# Patient Record
Sex: Male | Born: 1995 | Race: Black or African American | Hispanic: No | Marital: Single | Smoking: Former smoker
Health system: Southern US, Community
[De-identification: ages and names within clinical notes are randomized; demographics above are authoritative.]

## PROBLEM LIST (undated history)

## (undated) DIAGNOSIS — R569 Unspecified convulsions: Secondary | ICD-10-CM

## (undated) HISTORY — PX: MOUTH SURGERY: SHX715

## (undated) HISTORY — DX: Unspecified convulsions: R56.9

---
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
## 2016-01-30 ENCOUNTER — Encounter (HOSPITAL_COMMUNITY): Payer: Self-pay | Admitting: Emergency Medicine

## 2016-01-30 ENCOUNTER — Emergency Department (HOSPITAL_COMMUNITY)
Admission: EM | Admit: 2016-01-30 | Discharge: 2016-01-30 | Disposition: A | Discharge status: Home / Self Care | Payer: BLUE CROSS/BLUE SHIELD | Attending: Emergency Medicine | Admitting: Emergency Medicine

## 2016-01-30 DIAGNOSIS — F17200 Nicotine dependence, unspecified, uncomplicated: Secondary | ICD-10-CM | POA: Insufficient documentation

## 2016-01-30 DIAGNOSIS — L0291 Cutaneous abscess, unspecified: Secondary | ICD-10-CM

## 2016-01-30 DIAGNOSIS — L0201 Cutaneous abscess of face: Secondary | ICD-10-CM | POA: Diagnosis present

## 2016-01-30 MED ORDER — SULFAMETHOXAZOLE-TRIMETHOPRIM 800-160 MG PO TABS
1.0000 | ORAL_TABLET | Freq: Once | ORAL | Status: AC
  Administered 2016-01-30: 1 via ORAL
  Filled 2016-01-30: qty 1

## 2016-01-30 MED ORDER — LIDOCAINE HCL 2 % IJ SOLN
20.0000 mL | Freq: Once | INTRAMUSCULAR | Status: AC
  Administered 2016-01-30: 400 mg
  Filled 2016-01-30: qty 20

## 2016-01-30 MED ORDER — SULFAMETHOXAZOLE-TRIMETHOPRIM 800-160 MG PO TABS: 1.0000 | ORAL_TABLET | Freq: Two times a day (BID) | ORAL | 0 refills | Status: AC

## 2016-01-30 NOTE — ED Provider Notes (Signed)
[facility] Provider Note   CSN: [id number] Arrival date & time: 01/30/16  1628  By signing my name below, I, Placido SouLogan Joldersma, attest that this documentation has been prepared under the direction and in the presence of Newell RubbermaidJeffrey Thelia Tanksley, PA-C.  Electronically Signed: Placido SouLogan Joldersma, ED Scribe. 01/30/16. 5:06 PM.   History   Chief Complaint Chief Complaint  Patient presents with  . Abscess    l/side of face    HPI HPI Comments:   John King is a 20 y.o. male who presents to the Emergency Department complaining of a point of worsening moderate swelling to his left jaw onset in the past few days. Pt reports a h/o similar symptoms in the region which typically alleviate on their own but denies any of this severity. He reports associated pain and redness in the region. His pain worsens with palpation. He denies a h/o DM or prior infections which required abx. He denies fevers, chills, trismus or other associated symptoms at this time.   The history is provided by the patient. No language interpreter was used.    History reviewed. No pertinent past medical history.  There are no active problems to display for this patient.   History reviewed. No pertinent surgical history.    Home Medications    Prior to Admission medications   Medication Sig Start Date End Date Taking? Authorizing Provider  sulfamethoxazole-trimethoprim (BACTRIM DS,SEPTRA DS) 800-160 MG tablet Take 1 tablet by mouth 2 (two) times daily. 01/30/16 02/06/16  Eyvonne MechanicJeffrey Kayra Crowell, PA-C    Family History Family History  Problem Relation Age of Onset  . Cancer Mother   . Hypertension Mother     Social History Social History  Substance Use Topics  . Smoking status: Current Every Day Smoker    Packs/day: 1.00  . Smokeless tobacco: Never Used  . Alcohol use Yes     Allergies   Review of patient's allergies indicates no known allergies.   Review of Systems Review of Systems  Constitutional:  Negative for chills and fever.  Musculoskeletal: Positive for myalgias.  Skin: Positive for color change and rash.  Allergic/Immunologic: Negative for immunocompromised state.   Physical Exam Updated Vital Signs BP 110/72 (BP Location: Left Arm)   Pulse 64   Temp 98.3 F (36.8 C) (Oral)   Resp 16   SpO2 99%   Physical Exam  Constitutional: He is oriented to person, place, and time. He appears well-developed and well-nourished.  HENT:  Head: Normocephalic and atraumatic.  2 cm of induration, redness and tenderness along the left jaw. Localized. No TTP of the neck or soft tissue. FAROM of the jaw.   Eyes: EOM are normal.  Neck: Normal range of motion.  Cardiovascular: Normal rate.   Pulmonary/Chest: Effort normal. No respiratory distress.  Abdominal: Soft.  Musculoskeletal: Normal range of motion. He exhibits tenderness.  Neurological: He is alert and oriented to person, place, and time.  Skin: Skin is warm and dry. There is erythema.  Psychiatric: He has a normal mood and affect.  Nursing note and vitals reviewed.  ED Treatments / Results  Labs (all labs ordered are listed, but only abnormal results are displayed) Labs Reviewed - No data to display  EKG  EKG Interpretation None       Radiology No results found.  Procedures .Marland Kitchen.Incision and Drainage Date/Time: 01/30/2016 5:08 PM Performed by: Curlene DolphinHEDGES, Suella Cogar Authorized by: Curlene DolphinHEDGES, Asbury Hair   Consent:    Consent obtained:  Verbal   Consent given by:  Patient   Risks discussed:  Pain Location:    Type:  Abscess   Size:  2 cm    Location:  Head   Head location:  Face Anesthesia (see MAR for exact dosages):    Anesthesia method:  Local infiltration   Local anesthetic:  Lidocaine 2% w/o epi Procedure type:    Complexity:  Simple Procedure details:    Needle aspiration: no     Drainage amount:  Moderate   Wound treatment:  Wound left open   Packing materials:  1/4 in iodoform gauze Post-procedure details:     Patient tolerance of procedure:  Tolerated well, no immediate complications       DIAGNOSTIC STUDIES: Oxygen Saturation is 100% on RA, normal by my interpretation.    COORDINATION OF CARE: 5:05 PM Discussed next steps with pt. Pt verbalized understanding and is agreeable with the plan.     EMERGENCY DEPARTMENT US SOFT TISSUE INTERPRETATION "Study: Limited Ultrasound of the noted body part in comments below"  INDICATIONS: Soft tissue infection Multiple views of the body part are obtained with a multi-frequency linear probe  PERFORMED BY:  Myself  IMAGES ARCHIVED?: Yes  SIDE:Left  BODY PART:Other soft tisse (comment in note)  FINDINGS: Abcess present  LIMITATIONS:  Body Habitus  INTERPRETATION:  Abcess present  COMMENT:  Facial abscess   Medications Ordered in ED Medications  lidocaine (XYLOCAINE) 2 % (with pres) injection 400 mg (400 mg Infiltration Given 01/30/16 1747)  sulfamethoxazole-trimethoprim (BACTRIM DS,SEPTRA DS) 800-160 MG per tablet 1 tablet (1 tablet Oral Given 01/30/16 1811)     Initial Impression / Assessment and Plan / ED Course  I have reviewed the triage vital signs and the nursing notes.  Pertinent labs & imaging results that were available during my care of the patient were reviewed by me and considered in my medical decision making (see chart for details).  Clinical Course    Labs: none indicated  Imaging: none indicated  Consults: none  Therapeutics:   Assessment:  Plan:  Patient presents with an abscess to his face. I&D was successful here. Packing was placed. Patient's instructed to remove packing tomorrow, keep wound clean and covered. Antibiotics given, he is instructed return to emergency room if symptoms persist or do not improve. Pt given strict return precautions, verbalized understanding and agreement to today's plan and had no further questions or concerns at the time of discharge.    I personally performed the services  described in this documentation, which was scribed in my presence. The recorded information has been reviewed and is accurate. Final Clinical Impressions(s) / ED Diagnoses   Final diagnoses:  Abscess    New Prescriptions Discharge Medication List as of 01/30/2016  5:59 PM    START taking these medications   Details  sulfamethoxazole-trimethoprim (BACTRIM DS,SEPTRA DS) 800-160 MG tablet Take 1 tablet by mouth 2 (two) times daily., Starting Fri 01/30/2016, Until Fri 02/06/2016, Print         Eyvonne MechanicJeffrey Willford Rabideau, PA-C 01/30/16 [id number]    Cathren LaineKevin Steinl, MD 01/31/16 [phone]

## 2016-01-30 NOTE — Discharge Instructions (Signed)
Please remove packing tomorrow, keep wound clean and covered. Please use antibiotics as directed, follow up if symptoms worsen or do not improve.

## 2016-02-13 ENCOUNTER — Other Ambulatory Visit: Payer: Self-pay | Admitting: Nurse Practitioner

## 2016-02-13 ENCOUNTER — Ambulatory Visit
Admission: RE | Admit: 2016-02-13 | Discharge: 2016-02-13 | Disposition: A | Discharge status: Home / Self Care | Payer: BLUE CROSS/BLUE SHIELD | Source: Ambulatory Visit | Attending: Nurse Practitioner | Admitting: Nurse Practitioner

## 2016-02-13 DIAGNOSIS — M545 Low back pain: Secondary | ICD-10-CM

## 2016-03-03 ENCOUNTER — Other Ambulatory Visit: Payer: Self-pay | Admitting: Nurse Practitioner

## 2016-03-03 DIAGNOSIS — M545 Low back pain: Secondary | ICD-10-CM

## 2016-03-12 ENCOUNTER — Ambulatory Visit
Admission: RE | Admit: 2016-03-12 | Discharge: 2016-03-12 | Disposition: A | Discharge status: Home / Self Care | Payer: BLUE CROSS/BLUE SHIELD | Source: Ambulatory Visit | Attending: Nurse Practitioner | Admitting: Nurse Practitioner

## 2016-03-12 DIAGNOSIS — M545 Low back pain: Secondary | ICD-10-CM

## 2016-04-07 ENCOUNTER — Ambulatory Visit (INDEPENDENT_AMBULATORY_CARE_PROVIDER_SITE_OTHER): Payer: BLUE CROSS/BLUE SHIELD | Admitting: Family Medicine

## 2016-04-07 DIAGNOSIS — R05 Cough: Secondary | ICD-10-CM | POA: Insufficient documentation

## 2016-04-07 DIAGNOSIS — R059 Cough, unspecified: Secondary | ICD-10-CM | POA: Insufficient documentation

## 2016-04-07 MED ORDER — BENZONATATE 200 MG PO CAPS: 200.0000 mg | ORAL_CAPSULE | Freq: Three times a day (TID) | ORAL | 0 refills | Status: DC | PRN

## 2016-04-07 NOTE — Progress Notes (Signed)
   Subjective:    Patient ID: John King, male    DOB: 05-Mar-1996, 21 y.o.   MRN: [id number]  HPI Patient presents with cough.   Cough Began two days ago. Reports cough as dry and non-productive. Had difficulty sleeping last night because he was coughing so much. Also endorses scratchy (but not sore) throat, fatigue and vomiting two days ago which has since resolved. Reports one fever of 101F last night but none otherwise. Has taken Tylenol Cold, Benadryl, and Robitussin with minimal relief. Denies sneezing, nasal congestion. Has not had a flu shot this year. His girlfriend has been sick recently. He is able to eat and drink normally.   Family History:  Mother - Cancer PGF - Cancer, diabetes  Current every day smoker - 0.5ppd.  Smokes marijuana daily.   Review of Systems See HPI.     Objective:   Physical Exam  Constitutional: He is oriented to person, place, and time. He appears well-developed and well-nourished. No distress.  HENT:  Head: Normocephalic and atraumatic.  Nose: Nose normal.  Mouth/Throat: Oropharynx is clear and moist. No oropharyngeal exudate.  Eyes: Conjunctivae and EOM are normal. Right eye exhibits no discharge. Left eye exhibits no discharge.  Pulmonary/Chest: Effort normal and breath sounds normal. No respiratory distress. He has no wheezes. He has no rales.  Neurological: He is alert and oriented to person, place, and time.  Skin: Skin is warm and dry.  Psychiatric: He has a normal mood and affect. His behavior is normal.      Assessment & Plan:  Cough Likely viral. Well-appearing, afebrile, and well-hydrated in office. Lungs clear, normal WOB on RA, and no reported breathing difficulties at home.  - Tessalon perles q8 PRN - Honey and elevate head of bed when sleeping - Return if symptoms worsening or no improvement   Tarri AbernethyAbigail J Lancaster, MD, MPH PGY-2 [facility] Pager [phone]

## 2016-04-07 NOTE — Patient Instructions (Addendum)
It was nice meeting you today John King!  For your cough, you can begin taking one Tessalon perle up to every 8 hours as needed for coughing. You can also have honey (either a spoonful or mixed into a warm beverage) three or four times a day. It will be helpful to sleep with a couple of pillows behind your head tonight so you are more upright.   It is important to stay well-hydrated, so continue to drink as much water as you can.   If your symptoms are worsening or are not better in a few days, please let us know.   If you have any questions or concerns, please feel free to call the clinic.   Be well,  Dr. Natale MilchLancaster    IF you received an x-ray today, you will receive an invoice from [facility]. Please contact [facility] at [phone] with questions or concerns regarding your invoice.   IF you received labwork today, you will receive an invoice from [facility]. Please contact [facility] at [phone] with questions or concerns regarding your invoice.   Our billing staff will not be able to assist you with questions regarding bills from these companies.  You will be contacted with the lab results as soon as they are available. The fastest way to get your results is to activate your My Chart account. Instructions are located on the last page of this paperwork. If you have not heard from us regarding the results in 2 weeks, please contact this office.

## 2016-04-07 NOTE — Assessment & Plan Note (Signed)
Likely viral. Well-appearing, afebrile, and well-hydrated in office. Lungs clear, normal WOB on RA, and no reported breathing difficulties at home.  - Tessalon perles q8 PRN - Honey and elevate head of bed when sleeping - Return if symptoms worsening or no improvement

## 2017-11-09 IMAGING — MR MR LUMBAR SPINE W/O CM
5 series · 46 of 48 positions shown · non-contrast
Comparison: Lumbar radiographs 02/13/2016

CLINICAL DATA: Low back pain

EXAM:
MRI LUMBAR SPINE WITHOUT CONTRAST
TECHNIQUE: Multiplanar, multisequence MR imaging of the lumbar spine was
performed. No intravenous contrast was administered.

[Series 3: tirm sag · sagittal · 4.0mm · 0.55mm/px · 6 of 13 slices shown]
[im 1/13]
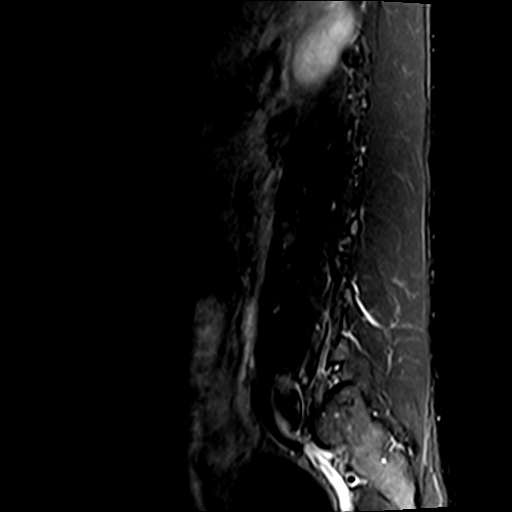
[im 3/13]
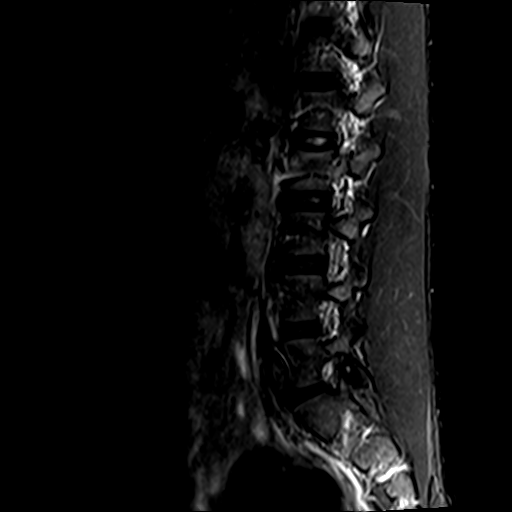
[im 5/13]
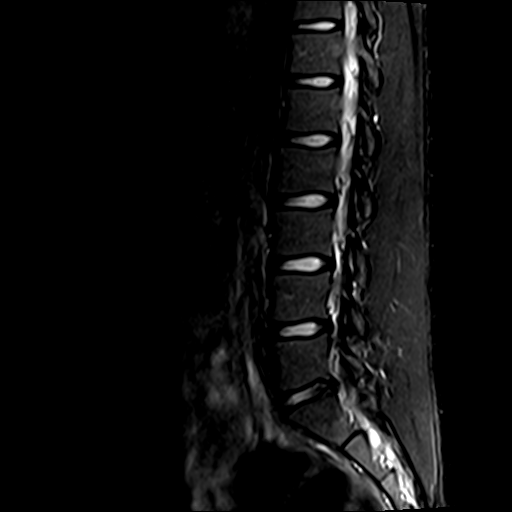
[im 8/13]
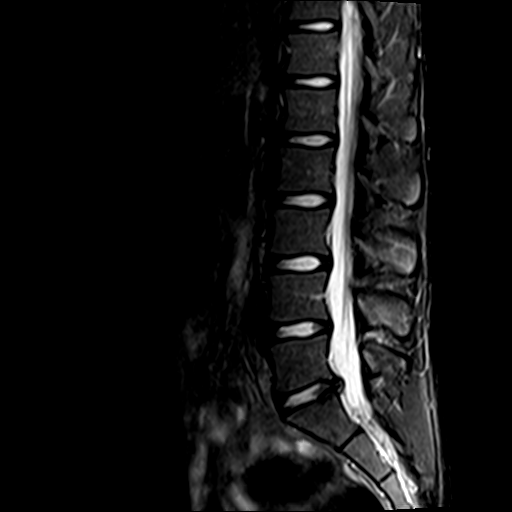
[im 10/13]
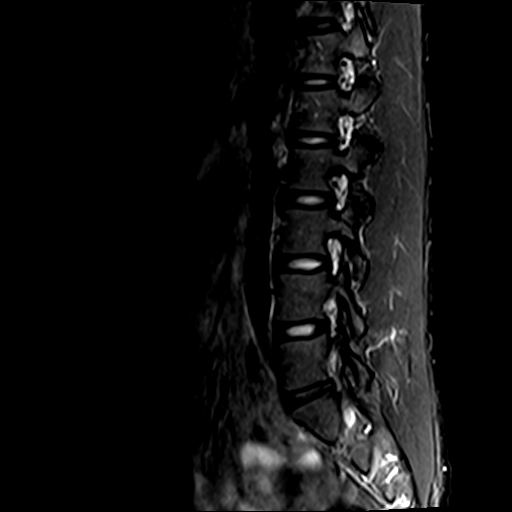
[im 13/13]
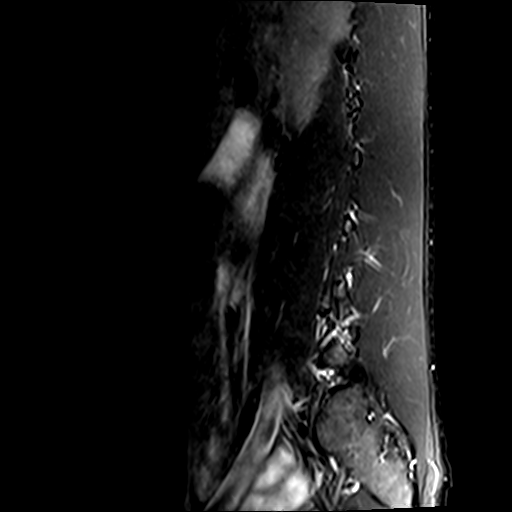

[Series 4: T2 · sagittal · 4.0mm · 0.88mm/px · 6 of 13 slices shown (1 of 2)]
[im 1/13]
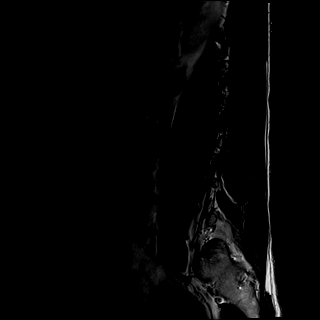
[im 3/13]
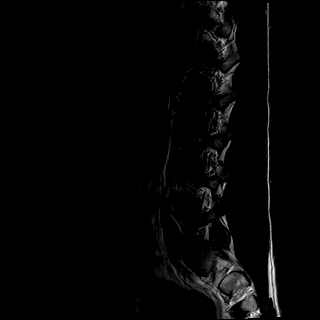
[im 5/13]
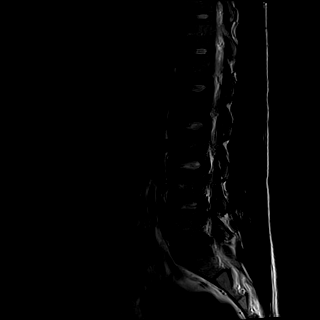
[im 8/13]
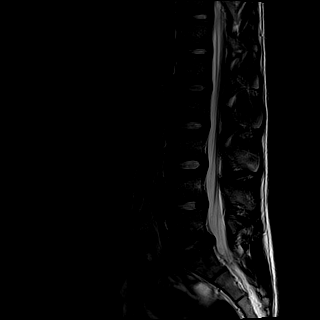
[im 10/13]
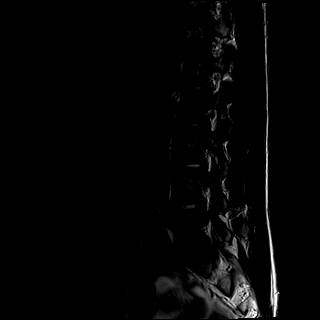
[im 13/13]
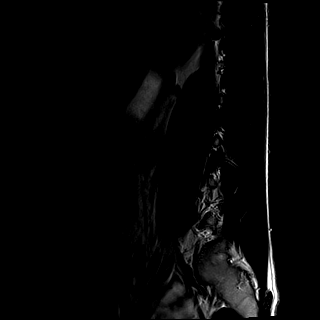

[Series 5: T1 · sagittal · 4.0mm · 0.88mm/px · 6 of 13 slices shown (1 of 2)]
[im 1/13]
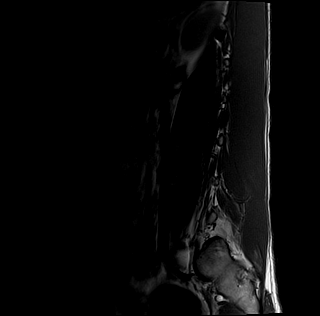
[im 3/13]
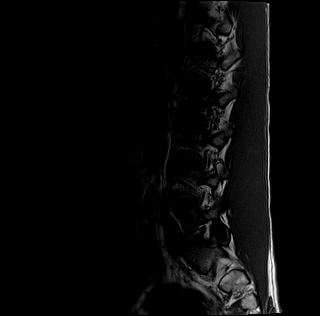
[im 5/13]
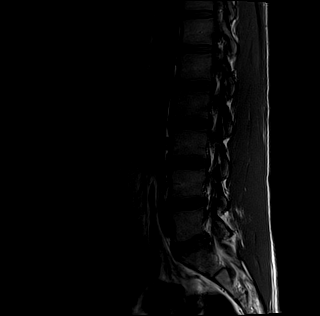
[im 8/13]
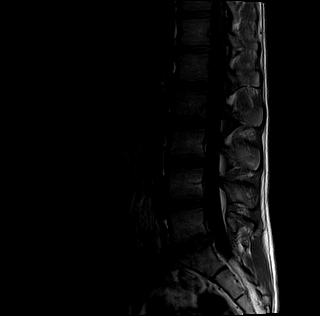
[im 10/13]
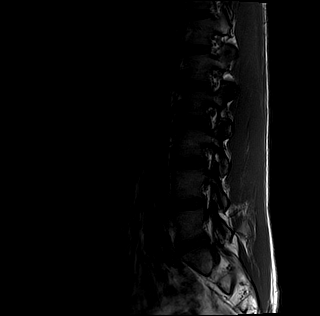
[im 13/13]
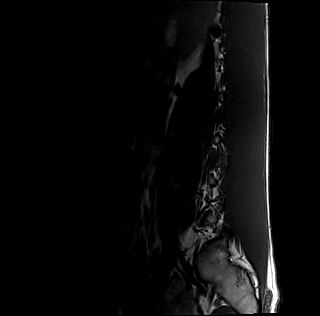

[Series 7: T2 · axial · 4.0mm · 0.78mm/px · z∈[-90,+75]mm · 15 of 30 slices shown (2 of 2)]
[im 1/30]
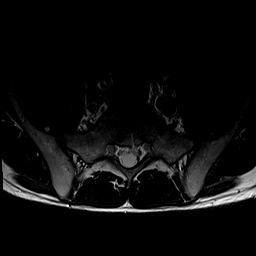
[im 3/30]
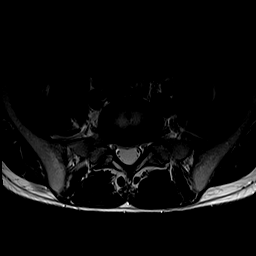
[im 5/30]
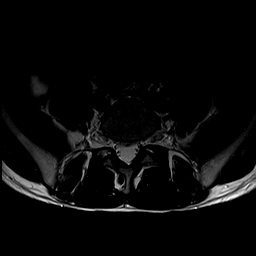
[im 7/30]
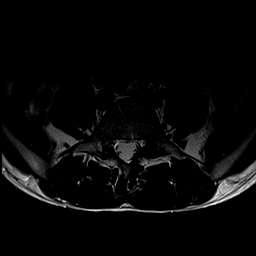
[im 9/30]
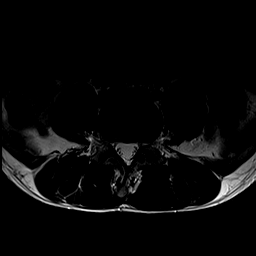
[im 11/30]
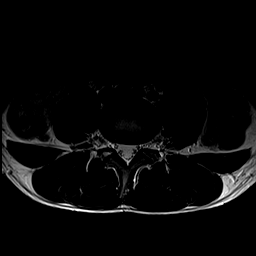
[im 13/30]
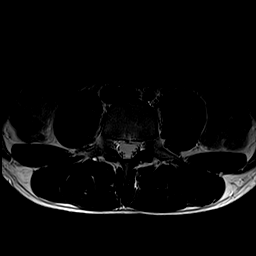
[im 15/30]
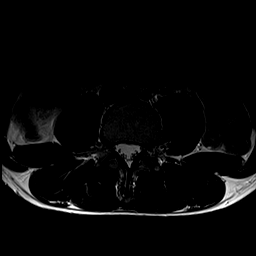
[im 17/30]
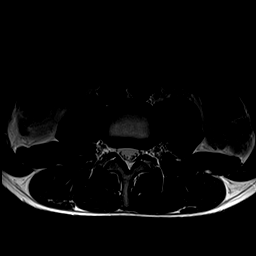
[im 19/30]
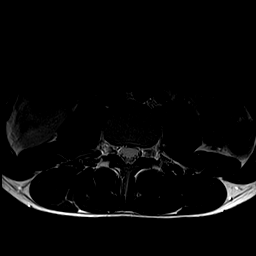
[im 21/30]
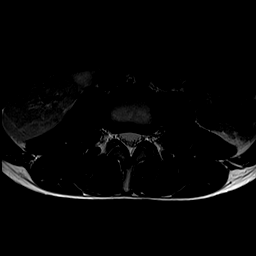
[im 23/30]
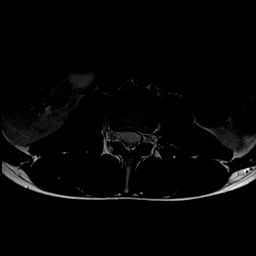
[im 25/30]
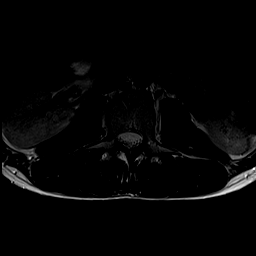
[im 27/30]
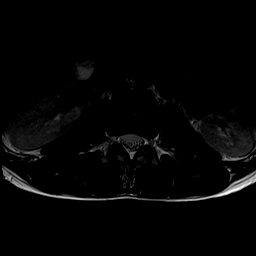
[im 30/30]
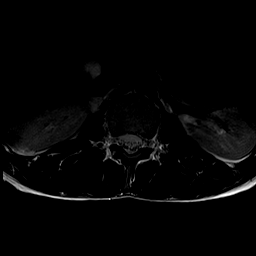

[Series 8: T1 · axial · 4.0mm · 0.78mm/px · z∈[-90,+75]mm · 13 of 30 slices shown (2 of 2)]
[im 1/30]
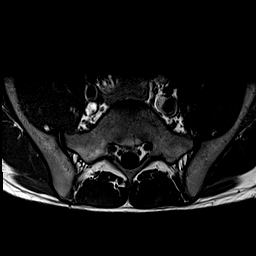
[im 3/30]
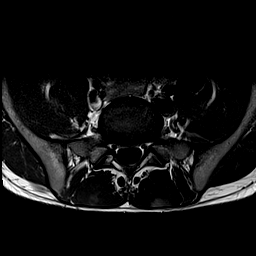
[im 5/30]
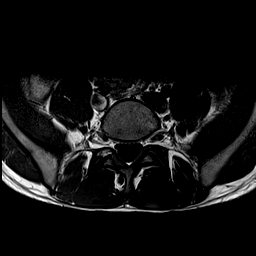
[im 7/30]
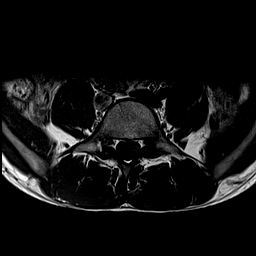
[im 9/30]
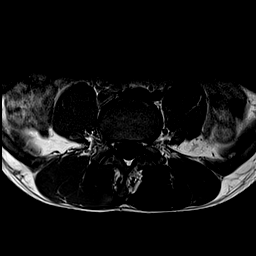
[im 11/30]
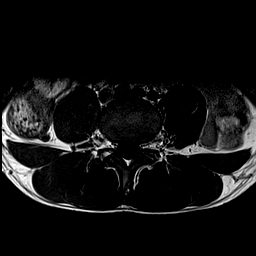
[im 13/30]
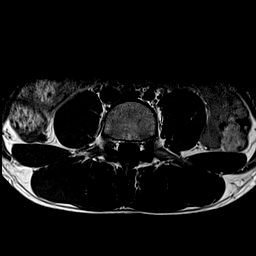
[im 15/30]
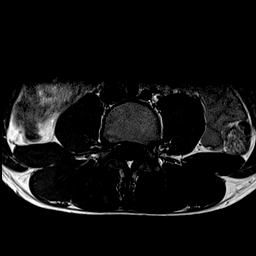
[im 17/30]
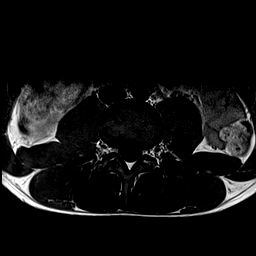
[im 19/30]
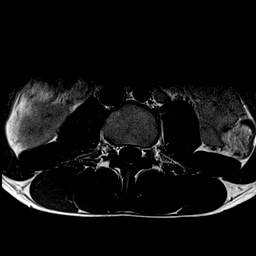
[im 21/30]
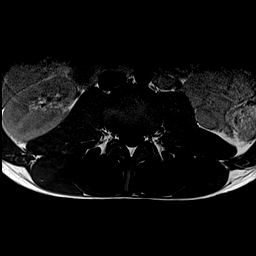
[im 25/30]
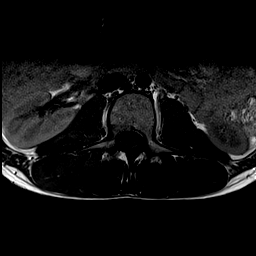
[im 30/30]
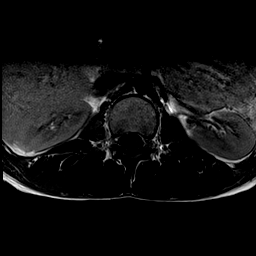

[46 of 48 positions shown; findings below may reference images not displayed]

FINDINGS: Segmentation:  Normal

Alignment:  Normal alignment.  Straightening of the lumbar lordosis.

Vertebrae:  Normal

Conus medullaris: Extends to the T12-L1 level and appears normal.

Paraspinal and other soft tissues: Paraspinous muscles normal.
Retroperitoneal structures normal.

Disc levels:

L1-2:  Negative

L2-3:  Negative

L3-4:  Negative

L4-5:  Negative

L5-S1: Shallow right paracentral disc protrusion. Potential
impingement of the right S1 nerve root. No significant spinal
stenosis.
IMPRESSION: Small right paracentral disc protrusion L5-S1 with potential
impingement of the right S1 nerve root.

## 2020-05-20 ENCOUNTER — Other Ambulatory Visit: Payer: Self-pay

## 2020-05-20 ENCOUNTER — Encounter (HOSPITAL_COMMUNITY): Payer: Self-pay

## 2020-05-20 ENCOUNTER — Ambulatory Visit (HOSPITAL_COMMUNITY)
Admission: EM | Admit: 2020-05-20 | Discharge: 2020-05-20 | Disposition: A | Discharge status: Home / Self Care | Payer: BC Managed Care – PPO | Attending: Family Medicine | Admitting: Family Medicine

## 2020-05-20 DIAGNOSIS — L739 Follicular disorder, unspecified: Secondary | ICD-10-CM

## 2020-05-20 MED ORDER — DOXYCYCLINE HYCLATE 100 MG PO CAPS: 100.0000 mg | ORAL_CAPSULE | Freq: Two times a day (BID) | ORAL | 0 refills | Status: AC

## 2020-05-20 NOTE — ED Triage Notes (Signed)
Pt presents with an abscess on left side of face. Pt states he gets hair bumps often and states he has not had the bump as big as they are now.

## 2020-05-20 NOTE — Discharge Instructions (Addendum)
-  start the antibiotic- doxycycline, twice daily for 7 days. Try to avoid taking this within an hour of eating.  -continue to use warm compresses 1-2x daily. You might notice the abscess start draining. This is normal, and good.  -you can use tylenol and ibuprofen for pain relief. You can take ibuprofen 800mg  three times daily with food. Tylenol 1000mg  three times daily. You can take these both together, or alternate them throughout the day. -come back and see if the infection gets worse despite treatment; if you develop new area of infection; fevers/chills; etc.

## 2020-05-20 NOTE — ED Provider Notes (Signed)
[facility]    CSN: [id number] Arrival date & time: 05/20/20  1016      History   Chief Complaint Chief Complaint  Patient presents with  . Abscess    HPI John King is a 25 y.o. male presenting with folliculitis/ facial abscesses. 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 Pt states he gets hair bumps often and states he has not had the bump as big as they are now. He thinks this was caused by barber who shaved too close. They typically drain on their own, but this one has only gotten bigger for the last 7 days and hasn't started draining. Has been doing warm compresses 1-2x daily.denies fevers/chills. Not taking any medications for pain relief.   HPI  History reviewed. No pertinent past medical history.  Patient Active Problem List   Diagnosis Date Noted  . Cough 04/07/2016    History reviewed. No pertinent surgical history.     Home Medications    Prior to Admission medications   Medication Sig Start Date End Date Taking? Authorizing Provider  doxycycline (VIBRAMYCIN) 100 MG capsule Take 1 capsule (100 mg total) by mouth 2 (two) times daily for 7 days. 05/20/20 05/27/20 Yes Rhys Martini, PA-C  benzonatate (TESSALON) 200 MG capsule Take 1 capsule (200 mg total) by mouth 3 (three) times daily as needed for cough. 04/07/16   Marquette Saa, MD    Family History Family History  Problem Relation Age of Onset  . Cancer Mother   . Hypertension Mother     Social History Social History   Tobacco Use  . Smoking status: Current Every Day Smoker    Packs/day: 0.50    Years: 3.00    Pack years: 1.50  . Smokeless tobacco: Never Used  Substance Use Topics  . Alcohol use: Yes    Comment: One drink per week  . Drug use: Yes    Types: Marijuana    Comment: Daily     Allergies   Patient has no known allergies.   Review of Systems Review of  Systems  Skin: Positive for wound.  All other systems reviewed and are negative.    Physical Exam Triage Vital Signs ED Triage Vitals  Enc Vitals Group     BP 05/20/20 1056 (!) 116/57     Pulse Rate 05/20/20 1056 64     Resp 05/20/20 1056 17     Temp 05/20/20 1056 98.2 F (36.8 C)     Temp Source 05/20/20 1056 Oral     SpO2 05/20/20 1056 100 %     Weight --      Height --      Head Circumference --      Peak Flow --      Pain Score 05/20/20 1055 8     Pain Loc --      Pain Edu? --      Excl. in GC? --    No data found.  Updated Vital Signs BP (!) 116/57 (BP Location: Right Arm)   Pulse 64   Temp 98.2 F (36.8 C) (Oral)   Resp 17   SpO2 100%   Visual Acuity Right Eye Distance:   Left Eye Distance:   Bilateral Distance:    Right Eye Near:   Left Eye Near:    Bilateral Near:     Physical Exam Vitals reviewed.  Constitutional:      Appearance: Normal appearance.  Cardiovascular:     Rate and Rhythm: Normal rate and regular rhythm.     Heart sounds: Normal heart sounds.  Pulmonary:     Effort: Pulmonary effort is normal.     Breath sounds: Normal breath sounds.  Skin:    Comments: L neck with multiple areas of swelling and erythema in various stages of healing. L chin with one 1cm x1cm area of tenderness and induration without fluctuance. No discharge able to be expressed. R chin with .5cm x .5cm area of induration and fluctuance, spontaneously draining.   Neurological:     General: No focal deficit present.     Mental Status: He is alert and oriented to person, place, and time.  Psychiatric:        Mood and Affect: Mood normal.        Behavior: Behavior normal.        Thought Content: Thought content normal.        Judgment: Judgment normal.      UC Treatments / Results  Labs (all labs ordered are listed, but only abnormal results are displayed) Labs Reviewed - No data to display  EKG   Radiology No results found.  Procedures Procedures  (including critical care time)  Medications Ordered in UC Medications - No data to display  Initial Impression / Assessment and Plan / UC Course  I have reviewed the triage vital signs and the nursing notes.  Pertinent labs & imaging results that were available during my care of the patient were reviewed by me and considered in my medical decision making (see chart for details).     This patient is a 25 year old male with a history of chronic folliculitis, presenting with current exacerbation.   Today he is afebrile and nontachycardic. Doxycycline sent as below. Tylenol/ibuprofen for pain. Continue warm compresses. Return precautions discussed.   Spent over 30 minutes obtaining H&P, performing physical, discussing results, treatment plan and plan for follow-up with patient. Patient agrees with plan.    Final Clinical Impressions(s) / UC Diagnoses   Final diagnoses:  Folliculitis     Discharge Instructions     -start the antibiotic- doxycycline, twice daily for 7 days. Try to avoid taking this within an hour of eating.  -continue to use warm compresses 1-2x daily. You might notice the abscess start draining. This is normal, and good.  -you can use tylenol and ibuprofen for pain relief. You can take ibuprofen 800mg  three times daily with food. Tylenol 1000mg  three times daily. You can take these both together, or alternate them throughout the day. -come back and see if the infection gets worse despite treatment; if you develop new area of infection; fevers/chills; etc.     ED Prescriptions    Medication Sig Dispense Auth. Provider   doxycycline (VIBRAMYCIN) 100 MG capsule Take 1 capsule (100 mg total) by mouth 2 (two) times daily for 7 days. 14 capsule , PA-C     PDMP not reviewed this encounter.   [place], PA-C 05/20/20 1215

## 2020-09-10 ENCOUNTER — Encounter (HOSPITAL_COMMUNITY): Payer: Self-pay

## 2020-09-10 ENCOUNTER — Emergency Department (HOSPITAL_COMMUNITY)
Admission: EM | Admit: 2020-09-10 | Discharge: 2020-09-11 | Disposition: A | Discharge status: Home / Self Care | Payer: BC Managed Care – PPO | Attending: Emergency Medicine | Admitting: Emergency Medicine

## 2020-09-10 ENCOUNTER — Other Ambulatory Visit: Payer: Self-pay

## 2020-09-10 DIAGNOSIS — Z5321 Procedure and treatment not carried out due to patient leaving prior to being seen by health care provider: Secondary | ICD-10-CM | POA: Diagnosis not present

## 2020-09-10 DIAGNOSIS — R509 Fever, unspecified: Secondary | ICD-10-CM | POA: Diagnosis present

## 2020-09-10 DIAGNOSIS — M791 Myalgia, unspecified site: Secondary | ICD-10-CM | POA: Diagnosis not present

## 2020-09-10 DIAGNOSIS — Z8616 Personal history of COVID-19: Secondary | ICD-10-CM | POA: Diagnosis not present

## 2020-09-10 DIAGNOSIS — R1110 Vomiting, unspecified: Secondary | ICD-10-CM | POA: Diagnosis not present

## 2020-09-10 DIAGNOSIS — R630 Anorexia: Secondary | ICD-10-CM | POA: Diagnosis not present

## 2020-09-10 LAB — COMPREHENSIVE METABOLIC PANEL
ALT: 20 U/L (ref 0–44)
AST: 30 U/L (ref 15–41)
Albumin: 5.4 g/dL — ABNORMAL HIGH (ref 3.5–5.0)
Alkaline Phosphatase: 69 U/L (ref 38–126)
Anion gap: 18 — ABNORMAL HIGH (ref 5–15)
BUN: 28 mg/dL — ABNORMAL HIGH (ref 6–20)
CO2: 22 mmol/L (ref 22–32)
Calcium: 10 mg/dL (ref 8.9–10.3)
Chloride: 98 mmol/L (ref 98–111)
Creatinine, Ser: 1.88 mg/dL — ABNORMAL HIGH (ref 0.61–1.24)
GFR, Estimated: 50 mL/min — ABNORMAL LOW (ref >60)
Glucose, Bld: 118 mg/dL — ABNORMAL HIGH (ref 70–99)
Potassium: 3.8 mmol/L (ref 3.5–5.1)
Sodium: 138 mmol/L (ref 135–145)
Total Bilirubin: 0.6 mg/dL (ref 0.3–1.2)
Total Protein: 9.9 g/dL — ABNORMAL HIGH (ref 6.5–8.1)

## 2020-09-10 LAB — CBC WITH DIFFERENTIAL/PLATELET
Abs Immature Granulocytes: 0.06 10*3/uL (ref 0.00–0.07)
Basophils Absolute: 0 10*3/uL (ref 0.0–0.1)
Basophils Relative: 0 %
Eosinophils Absolute: 0 10*3/uL (ref 0.0–0.5)
Eosinophils Relative: 0 %
HCT: 50.5 % (ref 39.0–52.0)
Hemoglobin: 17.2 g/dL — ABNORMAL HIGH (ref 13.0–17.0)
Immature Granulocytes: 1 %
Lymphocytes Relative: 9 %
Lymphs Abs: 1 10*3/uL (ref 0.7–4.0)
MCH: 30.4 pg (ref 26.0–34.0)
MCHC: 34.1 g/dL (ref 30.0–36.0)
MCV: 89.4 fL (ref 80.0–100.0)
Monocytes Absolute: 1 10*3/uL (ref 0.1–1.0)
Monocytes Relative: 9 %
Neutro Abs: 9 10*3/uL — ABNORMAL HIGH (ref 1.7–7.7)
Neutrophils Relative %: 81 %
Platelets: 218 10*3/uL (ref 150–400)
RBC: 5.65 MIL/uL (ref 4.22–5.81)
RDW: 13.6 % (ref 11.5–15.5)
WBC: 11.1 10*3/uL — ABNORMAL HIGH (ref 4.0–10.5)
nRBC: 0 % (ref 0.0–0.2)

## 2020-09-10 LAB — LIPASE, BLOOD: Lipase: 21 U/L (ref 11–51)

## 2020-09-10 MED ORDER — ONDANSETRON 4 MG PO TBDP
4.0000 mg | ORAL_TABLET | Freq: Once | ORAL | Status: AC
  Administered 2020-09-10: 17:00:00 4 mg via ORAL
  Filled 2020-09-10: qty 1

## 2020-09-10 NOTE — ED Triage Notes (Signed)
Covid positive 09/08/2020 with vomiting, fever, body aches, decreased appetite since 7pm last night.

## 2020-09-10 NOTE — ED Provider Notes (Signed)
Emergency Medicine Provider Triage Evaluation Note  John King , a 25 y.o. male  was evaluated in triage.  Pt complains of nausea, vomiting, upper abdominal discomfort beginning last night.  Patient also notes sore throat, body aches, fever beginning June 12.  COVID positive at home and at [facility].  Review of Systems  Positive: Sore throat, body aches, fever, abdominal pain, nausea, vomiting Negative: Chest pain, hematochezia, melena  Physical Exam  BP 120/90   Pulse 78   Temp 98.3 F (36.8 C)   Resp 18   Ht 5\' 7"  (1.702 m)   Wt 59 kg   SpO2 100%   BMI 20.37 kg/m  Gen:   Awake, no distress   Resp:  Normal effort  MSK:   Moves extremities without difficulty  Other:    Medical Decision Making  Medically screening exam initiated at 4:40 PM.  Appropriate orders placed.  John King was informed that the remainder of the evaluation will be completed by another provider, this initial triage assessment does not replace that evaluation, and the importance of remaining in the ED until their evaluation is complete.    Dairl Ponder, PA-C 09/10/20 1641    09/12/20, MD 09/11/20 1650

## 2022-02-04 ENCOUNTER — Emergency Department (HOSPITAL_BASED_OUTPATIENT_CLINIC_OR_DEPARTMENT_OTHER)
Admission: EM | Admit: 2022-02-04 | Discharge: 2022-02-04 | Disposition: A | Discharge status: Home / Self Care | Payer: BC Managed Care – PPO | Attending: Emergency Medicine | Admitting: Emergency Medicine

## 2022-02-04 ENCOUNTER — Encounter (HOSPITAL_BASED_OUTPATIENT_CLINIC_OR_DEPARTMENT_OTHER): Payer: Self-pay | Admitting: Emergency Medicine

## 2022-02-04 ENCOUNTER — Other Ambulatory Visit: Payer: Self-pay

## 2022-02-04 ENCOUNTER — Emergency Department (HOSPITAL_BASED_OUTPATIENT_CLINIC_OR_DEPARTMENT_OTHER): Payer: BC Managed Care – PPO

## 2022-02-04 DIAGNOSIS — S0990XA Unspecified injury of head, initial encounter: Secondary | ICD-10-CM | POA: Insufficient documentation

## 2022-02-04 DIAGNOSIS — Z79899 Other long term (current) drug therapy: Secondary | ICD-10-CM | POA: Insufficient documentation

## 2022-02-04 DIAGNOSIS — X58XXXA Exposure to other specified factors, initial encounter: Secondary | ICD-10-CM | POA: Insufficient documentation

## 2022-02-04 LAB — CBC WITH DIFFERENTIAL/PLATELET
Abs Immature Granulocytes: 0.07 10*3/uL (ref 0.00–0.07)
Basophils Absolute: 0.1 10*3/uL (ref 0.0–0.1)
Basophils Relative: 0 %
Eosinophils Absolute: 0 10*3/uL (ref 0.0–0.5)
Eosinophils Relative: 0 %
HCT: 44.7 % (ref 39.0–52.0)
Hemoglobin: 14.9 g/dL (ref 13.0–17.0)
Immature Granulocytes: 1 %
Lymphocytes Relative: 7 %
Lymphs Abs: 0.9 10*3/uL (ref 0.7–4.0)
MCH: 29.9 pg (ref 26.0–34.0)
MCHC: 33.3 g/dL (ref 30.0–36.0)
MCV: 89.6 fL (ref 80.0–100.0)
Monocytes Absolute: 1.1 10*3/uL — ABNORMAL HIGH (ref 0.1–1.0)
Monocytes Relative: 9 %
Neutro Abs: 10 10*3/uL — ABNORMAL HIGH (ref 1.7–7.7)
Neutrophils Relative %: 83 %
Platelets: 285 10*3/uL (ref 150–400)
RBC: 4.99 MIL/uL (ref 4.22–5.81)
RDW: 13.2 % (ref 11.5–15.5)
WBC: 12 10*3/uL — ABNORMAL HIGH (ref 4.0–10.5)
nRBC: 0 % (ref 0.0–0.2)

## 2022-02-04 LAB — BASIC METABOLIC PANEL
Anion gap: 11 (ref 5–15)
BUN: 11 mg/dL (ref 6–20)
CO2: 25 mmol/L (ref 22–32)
Calcium: 9.7 mg/dL (ref 8.9–10.3)
Chloride: 103 mmol/L (ref 98–111)
Creatinine, Ser: 0.83 mg/dL (ref 0.61–1.24)
GFR, Estimated: >60 mL/min (ref >60)
Glucose, Bld: 121 mg/dL — ABNORMAL HIGH (ref 70–99)
Potassium: 3.5 mmol/L (ref 3.5–5.1)
Sodium: 139 mmol/L (ref 135–145)

## 2022-02-04 LAB — ETHANOL: Alcohol, Ethyl (B): 10 mg/dL — ABNORMAL HIGH (ref <10)

## 2022-02-04 MED ORDER — ONDANSETRON HCL 4 MG/2ML IJ SOLN
4.0000 mg | Freq: Once | INTRAMUSCULAR | Status: AC
  Administered 2022-02-04: 4 mg via INTRAVENOUS
  Filled 2022-02-04: qty 2

## 2022-02-04 MED ORDER — SODIUM CHLORIDE 0.9 % IV BOLUS
1000.0000 mL | Freq: Once | INTRAVENOUS | Status: AC
  Administered 2022-02-04: 1000 mL via INTRAVENOUS

## 2022-02-04 NOTE — Discharge Instructions (Addendum)
You had 5 staples placed to your head, these will need to be removed within 5 to 7 days.  If you experience any worsening symptoms, new seizure you will need to return to the emergency department.

## 2022-02-04 NOTE — ED Provider Notes (Signed)
[facility] EMERGENCY DEPT Provider Note   CSN: [id number] Arrival date & time: 02/04/22  1311     History  Chief Complaint  Patient presents with   Head Injury    John King is a 26 y.o. male.  26 year old male with no past medical history presents to the ED complaining of headache along with nausea.  Patient woke up this morning to a gash to the posterior aspect of his head, reports his daughter was with him and she could not wake him up.  According to significant other at the bedside, there was significant amount of blood throughout the walls, on the bed.  Patient not recall any falls.  He does not have any diagnosed history of seizure disorder.  He is endorsing some nausea along with feeling overall unwell.  He did not take any medications for improvement in symptoms.  He is currently on no blood thinners.  Denies any trauma that he is aware of, denies any syncope.  Patient is a very poor historian.   The history is provided by the patient.  Head Injury Location:  L parietal Associated symptoms: headache and nausea   Associated symptoms: no vomiting        Home Medications Prior to Admission medications   Medication Sig Start Date End Date Taking? Authorizing Provider  benzonatate (TESSALON) 200 MG capsule Take 1 capsule (200 mg total) by mouth 3 (three) times daily as needed for cough. Patient not taking: Reported on 09/10/2020 04/07/16   Marquette Saa, MD  ibuprofen (ADVIL) 200 MG tablet Take 600 mg by mouth every 6 (six) hours as needed.    [provider]  Phenyleph-CPM-DM-APAP (TYLENOL CHILDRENS COLD/FLU) 2.5-1-5-160 MG/5ML SUSP Take 30 mLs by mouth at bedtime.    [provider]      Allergies    Patient has no known allergies.    Review of Systems   Review of Systems  Constitutional:  Negative for chills and fever.  HENT:  Negative for sore throat.   Respiratory:  Negative for shortness of breath.    Cardiovascular:  Negative for chest pain.  Gastrointestinal:  Positive for nausea. Negative for abdominal pain and vomiting.  Genitourinary:  Negative for flank pain.  Musculoskeletal:  Positive for myalgias.  Neurological:  Positive for headaches.  All other systems reviewed and are negative.   Physical Exam Updated Vital Signs BP 116/76   Pulse (!) 50   Temp 97.8 F (36.6 C)   Resp 16   Ht 5\' 10"  (1.778 m)   Wt 63.5 kg   SpO2 98%   BMI 20.09 kg/m  Physical Exam Vitals and nursing note reviewed.  Constitutional:      Appearance: Normal appearance.  HENT:     Head: Normocephalic.     Comments: Large laceration noted to the left parietal area.    Nose: Nose normal.     Mouth/Throat:     Mouth: Mucous membranes are moist.  Eyes:     Pupils: Pupils are equal, round, and reactive to light.     Comments: Pupils are pinpoint bilaterally.  Reactive  Cardiovascular:     Rate and Rhythm: Normal rate.  Pulmonary:     Effort: Pulmonary effort is normal.  Abdominal:     General: Abdomen is flat.     Palpations: Abdomen is soft.     Tenderness: There is no abdominal tenderness.  Musculoskeletal:     Cervical back: Normal range of motion and neck supple.  Skin:    General: Skin is warm and dry.  Neurological:     Mental Status: He is alert and oriented to person, place, and time.     ED Results / Procedures / Treatments   Labs (all labs ordered are listed, but only abnormal results are displayed) Labs Reviewed  BASIC METABOLIC PANEL - Abnormal; Notable for the following components:      Result Value   Glucose, Bld 121 (*)    All other components within normal limits  CBC WITH DIFFERENTIAL/PLATELET - Abnormal; Notable for the following components:   WBC 12.0 (*)    Neutro Abs 10.0 (*)    Monocytes Absolute 1.1 (*)    All other components within normal limits  ETHANOL - Abnormal; Notable for the following components:   Alcohol, Ethyl (B) 10 (*)    All other  components within normal limits  RAPID URINE DRUG SCREEN, HOSP PERFORMED  CBG MONITORING, ED    EKG None  Radiology CT Head Wo Contrast  Result Date: 02/04/2022 CLINICAL DATA:  Posterior head injury last night EXAM: CT HEAD WITHOUT CONTRAST TECHNIQUE: Contiguous axial images were obtained from the base of the skull through the vertex without intravenous contrast. RADIATION DOSE REDUCTION: This exam was performed according to the departmental dose-optimization program which includes automated exposure control, adjustment of the mA and/or kV according to patient size and/or use of iterative reconstruction technique. COMPARISON:  None Available. FINDINGS: Brain: No evidence of acute infarction, hemorrhage, hydrocephalus, extra-axial collection or mass lesion/mass effect. Venous sinuses are mildly hyperdense. Vascular: No hyperdense vessel or unexpected calcification. Skull: Normal. Negative for fracture or focal lesion. Sinuses/Orbits: Visualized portions of the paranasal sinuses are predominantly clear. Orbits are unremarkable. Other: Mastoid air cells are predominantly clear. IMPRESSION: 1. No acute intracranial abnormality. 2. Venous sinuses are mildly hyperdense, which is nonspecific but can be seen in the setting of dehydration. Electronically Signed   By: Maudry Mayhew M.D.   On: 02/04/2022 14:15    Procedures .Marland KitchenLaceration Repair  Date/Time: 02/04/2022 5:51 PM  Performed by: Claude Manges, PA-C Authorized by: Claude Manges, PA-C   Consent:    Consent obtained:  Verbal   Consent given by:  Patient   Risks discussed:  Infection, pain and poor cosmetic result Universal protocol:    Patient identity confirmed:  Verbally with patient Laceration details:    Location:  Scalp   Scalp location:  L parietal   Length (cm):  4.5   Depth (mm):  0.8 Skin repair:    Repair method:  Staples   Number of staples:  5 Approximation:    Approximation:  Close Repair type:    Repair type:   Simple Post-procedure details:    Dressing:  Open (no dressing)   Procedure completion:  Tolerated well, no immediate complications     Medications Ordered in ED Medications  sodium chloride 0.9 % bolus 1,000 mL (1,000 mLs Intravenous New Bag/Given 02/04/22 1627)  ondansetron (ZOFRAN) injection 4 mg (4 mg Intravenous Given 02/04/22 1632)    ED Course/ Medical Decision Making/ A&P                           Medical Decision Making Amount and/or Complexity of Data Reviewed Labs: ordered. Radiology: ordered. ECG/medicine tests: ordered.  Risk Prescription drug management.   This patient presents to the ED for concern of head injury, this involves a number of treatment options, and is a complaint that carries  with it a high risk of complications and morbidity.  The differential diagnosis includes syncope, seizure versus trauma.    Co morbidities: Discussed in HPI   Brief History:  Patient here complaining of head injury that occurred last night, he is unable to provide any history.  He reports he woke up with a gash to the left parietal aspect of his head.  Currently on no blood thinners, does not recall the incident.  Does not have any prior history of seizures, unknown whether he syncopized.  He is complaining of some dizziness along with some vomiting.  EMR reviewed including pt PMHx, past surgical history and past visits to ER.   See HPI for more details   Lab Tests:  I ordered and independently interpreted labs.  The pertinent results include:    I personally reviewed all laboratory work and imaging. Metabolic panel without any acute abnormality specifically kidney function within normal limits and no significant electrolyte abnormalities. CBC without leukocytosis or significant anemia.   Imaging Studies:  No imaging studies ordered for this patient  Cardiac Monitoring:  NA NA   Medicines ordered:  I ordered medication including bolus, zofran  for  symptomatic treatment Reevaluation of the patient after these medicines showed that the patient improved I have reviewed the patients home medicines and have made adjustments as needed  Reevaluation:  After the interventions noted above I re-evaluated patient and found that they have :improved   Social Determinants of Health:  The patient's social determinants of health were a factor in the care of this patient  Problem List / ED Course:  Patient here with a chief complaint of head injury, he reports he woke up this morning and was covered in blood, there was blood along his sheets, nature, the walls of the house.  Family member at the bedside reports his daughter tried to wake him up however she was unable to get him to respond.  They brought him in today via POV.  Patient does not have any prior history of seizures, he denies any trauma, denies any history of drug use, is unsure how he has a laceration to his scalp. On exam patient is ANO x4, neurological exam is benign.  His pupils are pinpoint at this time, he denies any drug use.  CT head ordered in triage did not show any acute intracranial pathology.  Upon furthering asking and questioning of patient, he is unsure whether he suffered a seizure, his mother does have a history of seizures he has never been diagnosed with seizures in the past.  We discussed checking for the lab work to find any metabolic component to his episode.  Labs were reviewed and interpreted by me, these are without any acute finding. Provided with bolus, Zofran to help with symptomatic treatment.  His laceration was repaired with 5 staples to the left side of his head.  Patient was monitored in the ED for 4-1/2 hours without any further episode of seizure, a level slightly elevated however he denies any alcohol use in the past 24 hours.  Patient has had oral intake while in the ED without any further vomiting.  He denies any chest pain, no notes of breath, no prior  history of cardiac disease, denies any tobacco use.  EKG was obtained that any signs of ischemia, I do not suspect cardiac etiology at this time. Patient stable for discharge.   Dispostion:  After consideration of the diagnostic results and the patients response to treatment, I feel  that the patent would benefit from further follow up with neurology.     Portions of this note were generated with Scientist, clinical (histocompatibility and immunogenetics). Dictation errors may occur despite best attempts at proofreading.   Final Clinical Impression(s) / ED Diagnoses Final diagnoses:  Injury of head, initial encounter    Rx / DC Orders ED Discharge Orders     None         Claude Manges, PA-C 02/04/22 1810    Pricilla Loveless, MD 02/05/22 1606

## 2022-02-04 NOTE — ED Triage Notes (Signed)
Pt arrives to ED with c/o head injury that occurred last night. Pt reports that he woke up this morning with a "gash" to his posterior head. He reports blood being on the floor, walls, and pillows. Pt reports that he denies knowledge of any injury. Associated symptoms include dizziness, vomiting.

## 2022-03-02 ENCOUNTER — Ambulatory Visit (INDEPENDENT_AMBULATORY_CARE_PROVIDER_SITE_OTHER): Payer: Self-pay | Admitting: Family Medicine

## 2022-03-02 ENCOUNTER — Encounter: Payer: Self-pay | Admitting: Family Medicine

## 2022-03-02 VITALS — BP 110/60 | HR 65 | Temp 98.2°F | Ht 71.0 in | Wt 140.1 lb

## 2022-03-02 DIAGNOSIS — G40909 Epilepsy, unspecified, not intractable, without status epilepticus: Secondary | ICD-10-CM

## 2022-03-02 NOTE — Progress Notes (Signed)
New Patient Office Visit  Subjective:  Patient ID: John King, male    DOB: 10-16-1995  Age: 26 y.o. MRN: [id number]  CC:  Chief Complaint  Patient presents with   Establish Care    Need new pcp ED follow-up on 02/04/22 had a seizure     HPI-here w/John King presents for new pt sz Nov 9-"sz".   Was home w/ ex-girlfriend dau 4yo.  Per her-pt went to bathroom, fell and hit head on bed and then shaking.  She called her mom.   Pt doesn't recall.  And at some point, pt got back in bed and back to sleep.   Took about for ex to get there. Pt had urinated on self.   No h/o sz.  Went to [employer] and did scan.  Hadn't been drinking, drugs.  Had eaten.  No h/o migraine.   That night-after event, HA. Ex-girlfriend had very hard time waking him up.  Had bitten R side of tongue as well. No problems since.   Minor discomfort at site of wound    Past Medical History:  Diagnosis Date   Seizures (HCC) 02-05-22    Past Surgical History:  Procedure Laterality Date   MOUTH SURGERY      Family History  Problem Relation Age of Onset   Cancer Mother 60       breast   Hypertension Mother    Hypertension Father    Alcohol abuse Father    Seizures Father        etoh withdrawal   CAD Other        50-60's. maternal side    Social History   Socioeconomic History   Marital status: Single    Spouse name: Not on file   Number of children: 0   Years of education: Not on file   Highest education level: Not on file  Occupational History   Not on file  Tobacco Use   Smoking status: Every Day    Types: Cigars   Smokeless tobacco: Never  Vaping Use   Vaping Use: Never used  Substance and Sexual Activity   Alcohol use: Yes    Alcohol/week: 7.0 standard drinks of alcohol    Types: 7 Shots of liquor per week    Comment: 2-3 drinks few times   Drug use: Yes    Types: Marijuana    Comment: Daily   Sexual activity: Yes    Birth control/protection: None   Other Topics Concern   Not on file  Social History Narrative   Unem.   Social Determinants of Health   Financial Resource Strain: Not on file  Food Insecurity: Not on file  Transportation Needs: Not on file  Physical Activity: Not on file  Stress: Not on file  Social Connections: Not on file  Intimate Partner Violence: Not on file    ROS  ROS: Gen: no fever, chills  Skin: no rash, itching ENT: no ear pain, ear drainage, nasal congestion, rhinorrhea, sinus pressure, sore throat.  Nosebleeds as child.  Nothing long time.(Mgpa had as well) Eyes: no blurry vision, double vision Resp: no cough, wheeze,SOB CV: no CP, palpitations, LE edema,  GI: no heartburn, n/v/d/c, abd pain GU: no dysuria, urgency, frequency, hematuria MSK: no joint pain, myalgias, back pain Neuro: no dizziness, headache, weakness, vertigo Psych: some anxiety. no SI   Objective:   Today's Vitals: BP 110/60   Pulse 65   Temp 98.2 F (36.8 C) (  Temporal)   Ht 5\' 11"  (1.803 m)   Wt 140 lb 2 oz (63.6 kg)   SpO2 97%   BMI 19.54 kg/m   Physical Exam  Gen: WDWN NAD thin AAM HEENT: NCAT, conjunctiva not injected, sclera nonicteric TM WNL B, OP moist, no exudates  NECK:  supple, no thyromegaly, no nodes, no carotid bruits CARDIAC: RRR, S1S2+, no murmur. DP 2+B LUNGS: CTAB. No wheezes ABDOMEN:  BS+, soft, NTND, No HSM, no masses EXT:  no edema MSK: no gross abnormalities.  NEURO: A&O x3.  CN II-XII intact.  PSYCH: normal mood. Good eye contact   Spent 40 minutes reviewing chart, getting history, discussing plan and ordering consults/labs.  Assessment & Plan:   Problem List Items Addressed This Visit   None Visit Diagnoses     Seizure disorder [facility])    -  Primary   Relevant Orders   Ambulatory referral to Neurology   Comprehensive metabolic panel   TSH   Magnesium     1.  Questionable seizure disorder-new onset.  Not sure if patient had a presyncopal episode, fell, hit head, and had  concussion and possible seizure versus new onset seizure which caused him to fall and hit his head.  He did have some incontinence.  Did not bite his tongue.  CT of the brain was negative.  Will check CMP, magnesium, TSH.  Will refer to neurology for further workup.  He is aware he cannot drive for 6 months.  Follow-up in 1 to 2 months, sooner if new symptoms.  Discussed with patient and grandmother to apply for Medicaid.  Advised not to be in the bathtub by himself, swimming.  Advised to stop smoking marijuana.  Outpatient Encounter Medications as of 03/02/2022  Medication Sig   [DISCONTINUED] benzonatate (TESSALON) 200 MG capsule Take 1 capsule (200 mg total) by mouth 3 (three) times daily as needed for cough. (Patient not taking: Reported on 09/10/2020)   [DISCONTINUED] ibuprofen (ADVIL) 200 MG tablet Take 600 mg by mouth every 6 (six) hours as needed. (Patient not taking: Reported on 03/02/2022)   [DISCONTINUED] Phenyleph-CPM-DM-APAP (TYLENOL CHILDRENS COLD/FLU) 2.5-1-5-160 MG/5ML SUSP Take 30 mLs by mouth at bedtime. (Patient not taking: Reported on 03/02/2022)   No facility-administered encounter medications on file as of 03/02/2022.    Follow-up: Return in about 2 months (around 05/03/2022) for poss sz.   07/02/2022, MD

## 2022-03-02 NOTE — Patient Instructions (Signed)
Welcome to [facility] at [facility]! It was a pleasure meeting you today. ? ?As discussed, Please schedule a 2 month follow up visit today. ? ?PLEASE NOTE: ? ?If you had any LAB tests please let us know if you have not heard back within a few days. You may see your results on MyChart before we have a chance to review them but we will give you a call once they are reviewed by us. If we ordered any REFERRALS today, please let us know if you have not heard from their office within the next week.  ?Let us know through MyChart if you are needing REFILLS, or have your pharmacy send us the request. You can also use MyChart to communicate with me or any office staff. ? ?Please try these tips to maintain a healthy lifestyle: ? ?Eat most of your calories during the day when you are active. Eliminate processed foods including packaged sweets (pies, cakes, cookies), reduce intake of potatoes, white bread, white pasta, and white rice. Look for whole grain options, oat flour or almond flour. ? ?Each meal should contain half fruits/vegetables, one quarter protein, and one quarter carbs (no bigger than a computer mouse). ? ?Cut down on sweet beverages. This includes juice, soda, and sweet tea. Also watch fruit intake, though this is a healthier sweet option, it still contains natural sugar! Limit to 3 servings daily. ? ?Drink at least 1 glass of water with each meal and aim for at least 8 glasses per day ? ?Exercise at least 150 minutes every week.   ?

## 2022-03-03 LAB — COMPREHENSIVE METABOLIC PANEL
ALT: 10 U/L (ref 0–53)
AST: 17 U/L (ref 0–37)
Albumin: 4.6 g/dL (ref 3.5–5.2)
Alkaline Phosphatase: 59 U/L (ref 39–117)
BUN: 9 mg/dL (ref 6–23)
CO2: 29 mEq/L (ref 19–32)
Calcium: 9.8 mg/dL (ref 8.4–10.5)
Chloride: 104 mEq/L (ref 96–112)
Creatinine, Ser: 1 mg/dL (ref 0.40–1.50)
GFR: 103.2 mL/min (ref >60.00)
Glucose, Bld: 84 mg/dL (ref 70–99)
Potassium: 4.8 mEq/L (ref 3.5–5.1)
Sodium: 140 mEq/L (ref 135–145)
Total Bilirubin: 0.4 mg/dL (ref 0.2–1.2)
Total Protein: 7.1 g/dL (ref 6.0–8.3)

## 2022-03-03 LAB — TSH: TSH: 0.33 u[IU]/mL — ABNORMAL LOW (ref 0.35–5.50)

## 2022-03-03 LAB — MAGNESIUM: Magnesium: 2 mg/dL (ref 1.5–2.5)

## 2022-03-03 NOTE — Progress Notes (Signed)
Lab ok except thyroid may be hyper-can lab add FT4 and FT3?  If not, needs to redraw

## 2022-03-04 ENCOUNTER — Other Ambulatory Visit (INDEPENDENT_AMBULATORY_CARE_PROVIDER_SITE_OTHER): Payer: Self-pay

## 2022-03-04 DIAGNOSIS — G40909 Epilepsy, unspecified, not intractable, without status epilepticus: Secondary | ICD-10-CM

## 2022-03-04 LAB — T3, FREE: T3, Free: 3.9 pg/mL (ref 2.3–4.2)

## 2022-03-04 LAB — T4, FREE: Free T4: 0.98 ng/dL (ref 0.60–1.60)

## 2022-03-05 ENCOUNTER — Telehealth: Payer: Self-pay | Admitting: Family Medicine

## 2022-03-05 NOTE — Telephone Encounter (Signed)
Returned call to patient. Gave lab results and recommendations. Patient verbalized understanding. 

## 2022-03-05 NOTE — Telephone Encounter (Signed)
Pt states: -Returning phone call about labs. -Will have phone ringer on an available any time for a call back.   Pt request: -call back

## 2022-03-12 ENCOUNTER — Encounter: Payer: Self-pay | Admitting: Neurology

## 2022-03-23 ENCOUNTER — Telehealth: Payer: Self-pay | Admitting: Family Medicine

## 2022-03-23 NOTE — Telephone Encounter (Signed)
Patient states: -Wanted to let PCP know that since OV with her on 12/05, he has had 2 more seizures.  - Seizures occurred on 03/15/22 and 03/22/22. He has no lasting symptoms despite body aches  - He has his neurology visit on 03/24/22 w/ Dr. Karel Jarvis.

## 2022-03-24 ENCOUNTER — Encounter: Payer: Self-pay | Admitting: Neurology

## 2022-03-24 ENCOUNTER — Ambulatory Visit (INDEPENDENT_AMBULATORY_CARE_PROVIDER_SITE_OTHER): Payer: Self-pay | Admitting: Neurology

## 2022-03-24 VITALS — BP 119/72 | HR 92 | Ht 69.0 in | Wt 135.8 lb

## 2022-03-24 DIAGNOSIS — R569 Unspecified convulsions: Secondary | ICD-10-CM

## 2022-03-24 MED ORDER — LEVETIRACETAM 1000 MG PO TABS: ORAL_TABLET | ORAL | 11 refills | Status: DC

## 2022-03-24 NOTE — Progress Notes (Signed)
NEUROLOGY CONSULTATION NOTE  DEDRICK King MRN: [id number] DOB: 02/26/1996  Referring provider: Dr. Jeani Sow Primary care provider: Dr. Jeani Sow  Reason for consult:  seizure  Dear Dr Ruthine Dose:  Thank you for your kind referral of John King for consultation of the above symptoms. Although his history is well known to you, please allow me to reiterate it for the purpose of our medical record. The patient was accompanied to the clinic by his mother Ranell Patrick who also provides collateral information. Records and images were personally reviewed where available.   HISTORY OF PRESENT ILLNESS: This is a 26 year old right-handed man presenting for evaluation of new onset seizures. His mother is present to provide additional information. He was in his usual state of health until 02/04/22. He recalls sleeping beside his 27 year old, woke up an hour later feeling fine and going back to sleep. He then woke up and saw the girl's mother with blood around the room. The child had called her mother, he apparently got up from bed, went to the bathroom, then as he came out he fell and had a seizure. He hit the left side of his head and then went back to bed but has no recollection of this. He had bit his tongue, urinary incontinence, and required staples for the head injury. He was brought to the ER where bloodwork showed a WBC of 12, EtOH 10. Head CT no acute changes, venous sinuses were noted to be mildly hyperdense, nonspecific but can be seen in the setting of dehydration. He then had another seizure on 03/15/22. He recalls feeling tired the day prior. He was visiting a friend who told him that something was off when he got there, he went to sleep then woke up to EMS around him. He was brought to [facility], per ER notes, girlfriend woke up to patient having a seizure in bed, he bit his tongue and had nausea/vomiting. Head CT noted a vague small focus of hypoattenuation in the right frontal  lobe that is favored to be artifactual. Bloodwork normal. He was discharged home on Levetiracetam 750mg  BID.He reports missing 2 doses the day before Christmas then having another nocturnal seizure on 03/22/22. He again bit his tongue. He has some drowsiness with the Levetiracetam. He has occasional body twitches in his head, neck, and back that he attributes to bulging discs. His legs would just jump uncontrollable sometimes, twitching and trembling. After a long day, his head may twitch uncontrollably. No associated confusion. This started around are 22 or 23. He has occasional episodes of deja vu. No staring/unresponsive episodes, focal numbness/tingling/weakness, olfactory/gustatory hallucinations. He used to have migraines but denies any more headaches. No dizziness, diplopia, dysarthria/dysphagia, bowel/bladder dysfunction. He usually gets 7-9 hours of sleep. He drinks 2-3 liquor beverages daily. He smokes marijuana. He denies any alcohol prior to the first seizure but recalls having a drink the night before the second and third seizures. He lives with his mother. Mood is fine.   Epilepsy Risk Factors:  His mother had one seizure and takes Topiramate. HIs father had 2 alcohol-related seizures. He had a normal birth and early development.  There is no history of febrile convulsions, CNS infections such as meningitis/encephalitis, significant traumatic brain injury, neurosurgical procedures, or family history of seizures.   PAST MEDICAL HISTORY: Past Medical History:  Diagnosis Date   Seizures (HCC) 02-05-22    PAST SURGICAL HISTORY: Past Surgical History:  Procedure Laterality Date  MOUTH SURGERY      MEDICATIONS: Current Outpatient Medications on File Prior to Visit  Medication Sig Dispense Refill   levETIRAcetam (KEPPRA) 750 MG tablet Take 750 mg by mouth 2 (two) times daily.     No current facility-administered medications on file prior to visit.    ALLERGIES: No Known  Allergies  FAMILY HISTORY: Family History  Problem Relation Age of Onset   Cancer Mother 36       breast   Hypertension Mother    Hypertension Father    Alcohol abuse Father    Seizures Father        etoh withdrawal   CAD Other        50-60's. maternal side    SOCIAL HISTORY: Social History   Socioeconomic History   Marital status: Single    Spouse name: Not on file   Number of children: 0   Years of education: Not on file   Highest education level: Not on file  Occupational History   Not on file  Tobacco Use   Smoking status: Every Day    Types: Cigars   Smokeless tobacco: Never  Vaping Use   Vaping Use: Some days  Substance and Sexual Activity   Alcohol use: Yes    Alcohol/week: 7.0 standard drinks of alcohol    Types: 7 Shots of liquor per week    Comment: 2-3 drinks few times   Drug use: Yes    Types: Marijuana    Comment: Daily   Sexual activity: Yes    Birth control/protection: None  Other Topics Concern   Not on file  Social History Narrative   Unem.   Are you right handed or left handed? Right    Are you currently employed ? No    What is your current occupation?   Do you live at home alone? no   Who lives with you? With family    What type of home do you live in: 1 story or 2 story?  15 steps        Social Determinants of Health   Financial Resource Strain: Not on file  Food Insecurity: Not on file  Transportation Needs: Not on file  Physical Activity: Not on file  Stress: Not on file  Social Connections: Not on file  Intimate Partner Violence: Not on file     PHYSICAL EXAM: Vitals:   03/24/22 1246  BP: 119/72  Pulse: 92  SpO2: 98%   General: No acute distress Head:  Normocephalic/atraumatic Skin/Extremities: No rash, no edema Neurological Exam: Mental status: alert and awake, no dysarthria or aphasia, Fund of knowledge is appropriate.  Recent and remote memory are intact.  Attention and concentration are normal.    Cranial  nerves: CN I: not tested CN II: pupils equal, round, visual fields intact CN III, IV, VI:  full range of motion, no nystagmus, no ptosis CN V: facial sensation intact CN VII: upper and lower face symmetric CN VIII: hearing intact to conversation Bulk & Tone: normal, no fasciculations. Motor: 5/5 throughout with no pronator drift. Sensation: intact to light touch, cold, pin, vibration sense.  No extinction to double simultaneous stimulation.  Romberg test negative Deep Tendon Reflexes: +2 throughout Cerebellar: no incoordination on finger to nose testing Gait: narrow-based and steady, able to tandem walk adequately. Tremor: none   IMPRESSION: This is a 26 year old right-handed man with a new onset seizures, he has had 3 nocturnal seizures. Etiology unclear. MRI brain with and  without contrast and EEG will be ordered for seizure classification once insurance issues allow. We discussed increasing Levetiracetam to 1000mg  BID. Discussed avoidance of seizure triggers, including missing medication, sleep deprivation, alcohol.  [place] driving laws were discussed with the patient, and he knows to stop driving after a seizure, until 6 months seizure-free. Follow-up in 3 months, call for any changes.    Thank you for allowing me to participate in the care of this patient. Please do not hesitate to call for any questions or concerns.   , M.D.  CC: Dr. Patrcia Dolly

## 2022-03-24 NOTE — Patient Instructions (Signed)
Good to meet you.  Apply for [facility]. We would like to do a brain MRI with and without contrast and EEG  2. Increase Keppra (Levetiracetam) to 1000mg : take 1 tablet twice a day  3. Follow-up in 3 months, call for any changes   Seizure Precautions: 1. If medication has been prescribed for you to prevent seizures, take it exactly as directed.  Do not stop taking the medicine without talking to your doctor first, even if you have not had a seizure in a long time.   2. Avoid activities in which a seizure would cause danger to yourself or to others.  Don't operate dangerous machinery, swim alone, or climb in high or dangerous places, such as on ladders, roofs, or girders.  Do not drive unless your doctor says you may.  3. If you have any warning that you may have a seizure, lay down in a safe place where you can't hurt yourself.    4.  No driving for 6 months from last seizure, as per [facility].   Please refer to the following link on the Epile[employer] of America's website for more information: http://www.epilepsyfoundation.org/answerplace/Social/driving/drivingu.cfm   5.  Maintain good sleep hygiene. Start weaning down alcohol intake  6.  Contact your doctor if you have any problems that may be related to the medicine you are taking.  7.  Call 911 and bring the patient back to the ED if:        A.  The seizure lasts longer than 5 minutes.       B.  The patient doesn't awaken shortly after the seizure  C.  The patient has new problems such as difficulty seeing, speaking or moving  D.  The patient was injured during the seizure  E.  The patient has a temperature over 102 F (39C)  F.  The patient vomited and now is having trouble breathing

## 2022-04-14 ENCOUNTER — Encounter: Payer: Self-pay | Admitting: Neurology

## 2022-05-03 ENCOUNTER — Ambulatory Visit (INDEPENDENT_AMBULATORY_CARE_PROVIDER_SITE_OTHER): Payer: Self-pay | Admitting: Family Medicine

## 2022-05-03 ENCOUNTER — Encounter: Payer: Self-pay | Admitting: Family Medicine

## 2022-05-03 VITALS — BP 98/60 | HR 87 | Temp 98.0°F | Ht 69.0 in | Wt 137.0 lb

## 2022-05-03 DIAGNOSIS — Z8619 Personal history of other infectious and parasitic diseases: Secondary | ICD-10-CM

## 2022-05-03 DIAGNOSIS — G40909 Epilepsy, unspecified, not intractable, without status epilepticus: Secondary | ICD-10-CM | POA: Insufficient documentation

## 2022-05-03 DIAGNOSIS — L738 Other specified follicular disorders: Secondary | ICD-10-CM

## 2022-05-03 MED ORDER — DOXYCYCLINE HYCLATE 100 MG PO TABS: 100.0000 mg | ORAL_TABLET | Freq: Two times a day (BID) | ORAL | 1 refills | Status: DC

## 2022-05-03 MED ORDER — CLINDAMYCIN PHOSPHATE 1 % EX GEL: Freq: Two times a day (BID) | CUTANEOUS | 4 refills | Status: DC

## 2022-05-03 NOTE — Patient Instructions (Signed)
It was very nice to see you today!  Doxy for now and clindagel for prevention.   PLEASE NOTE:  If you had any lab tests please let us know if you have not heard back within a few days. You may see your results on MyChart before we have a chance to review them but we will give you a call once they are reviewed by [place]. If we ordered any referrals today, please let us know if you have not heard from their office within the next week.   Please try these tips to maintain a healthy lifestyle:  Eat most of your calories during the day when you are active. Eliminate processed foods including packaged sweets (pies, cakes, cookies), reduce intake of potatoes, white bread, white pasta, and white rice. Look for whole grain options, oat flour or almond flour.  Each meal should contain half fruits/vegetables, one quarter protein, and one quarter carbs (no bigger than a computer mouse).  Cut down on sweet beverages. This includes juice, soda, and sweet tea. Also watch fruit intake, though this is a healthier sweet option, it still contains natural sugar! Limit to 3 servings daily.  Drink at least 1 glass of water with each meal and aim for at least 8 glasses per day  Exercise at least 150 minutes every week.

## 2022-05-03 NOTE — Progress Notes (Signed)
   Subjective:     Patient ID: John King, male    DOB: [birth year], 27 y.o.   MRN: [id number]  Chief Complaint  Patient presents with   Follow-up    2 month follow-up for seizure, had two since last visit, 12/18 and 12/25, saw neurology and they increased dose of medication, now on 1000 mg      HPI-here w/friend Destin Sz-2 since last ov.  Seeing neuro.  Increased Keppra.   No sz since.  Just more tired and more irrit. Ingrown hair R neck-common for him.  No f/c.  Some soaks.  Won't "pop".  No d/c. Doxy didn't help in past GF-saw doc-labs neg for hsv 2.  Swab B.  Pt has cold sores.   Health Maintenance Due  Topic Date Due   DTaP/Tdap/Td (2 - Td or Tdap) 10/31/2016    Past Medical History:  Diagnosis Date   Seizures ([place]) 02-05-22    Past Surgical History:  Procedure Laterality Date   MOUTH SURGERY      Outpatient Medications Prior to Visit  Medication Sig Dispense Refill   levETIRAcetam (KEPPRA) 1000 MG tablet Take 1 tablet twice a day 60 tablet 11   No facility-administered medications prior to visit.    No Known Allergies ROS neg/noncontributory except as noted HPI/below      Objective:     BP 98/60   Pulse 87   Temp 98 F (36.7 C) (Temporal)   Ht 5\' 9"  (1.753 m)   Wt 137 lb (62.1 kg)   SpO2 97%   BMI 20.23 kg/m  Wt Readings from Last 3 Encounters:  05/03/22 137 lb (62.1 kg)  03/24/22 135 lb 12.8 oz (61.6 kg)  03/02/22 140 lb 2 oz (63.6 kg)    Physical Exam   Gen: WDWN NAD HEENT: NCAT, conjunctiva not injected, sclera nonicteric NECK:  supple, no thyromegaly, + shotty nodes, no carotid bruits CARDIAC: RRR, S1S2+, no murmur. ABDOMEN:  BS+, soft, NTND, No HSM, no masses EXT:  no edema MSK: no gross abnormalities. Some spasm muscles R inf shoulder.  NEURO: A&O x3.  CN II-XII intact.  PSYCH: normal mood. Good eye contact Approx 1.5cm nodule R neck near mandible-scarred.  Not really tender not fluctuant.      Assessment & Plan:    Problem List Items Addressed This Visit       Nervous and Auditory   Seizure disorder ([place]) - Primary     Other   History of cold sores   Other Visit Diagnoses     Folliculitis barbae          Sz disorder-chronic but new.  Seeing neuro.  Stable on keppra.   Folliculitis barbae-chronic w/flare.  Doxy 100mg  bid x 10d.  Clindagel for prevention.   H/o cold sores and now GF w/poss HSV-discussed pros/cons of HSV testing esp suspect type 1+(but poss type 2).  They decided to hold off awaiting her cx results.    Meds ordered this encounter  Medications   doxycycline (VIBRA-TABS) 100 MG tablet    Sig: Take 1 tablet (100 mg total) by mouth 2 (two) times daily.    Dispense:  20 tablet    Refill:  1   clindamycin (CLINDAGEL) 1 % gel    Sig: Apply topically 2 (two) times daily.    Dispense:  30 g    Refill:  [place], MD

## 2022-06-21 ENCOUNTER — Ambulatory Visit: Payer: Self-pay | Admitting: Neurology

## 2022-06-28 ENCOUNTER — Ambulatory Visit: Payer: Self-pay | Admitting: Neurology

## 2022-08-01 ENCOUNTER — Emergency Department (HOSPITAL_COMMUNITY): Payer: Medicaid Other

## 2022-08-01 ENCOUNTER — Other Ambulatory Visit: Payer: Self-pay

## 2022-08-01 ENCOUNTER — Emergency Department (HOSPITAL_COMMUNITY)
Admission: EM | Admit: 2022-08-01 | Discharge: 2022-08-01 | Disposition: A | Discharge status: Home / Self Care | Payer: Medicaid Other | Attending: Student | Admitting: Student

## 2022-08-01 DIAGNOSIS — R112 Nausea with vomiting, unspecified: Secondary | ICD-10-CM | POA: Diagnosis not present

## 2022-08-01 DIAGNOSIS — R569 Unspecified convulsions: Secondary | ICD-10-CM | POA: Insufficient documentation

## 2022-08-01 DIAGNOSIS — R410 Disorientation, unspecified: Secondary | ICD-10-CM | POA: Diagnosis not present

## 2022-08-01 DIAGNOSIS — K2900 Acute gastritis without bleeding: Secondary | ICD-10-CM | POA: Diagnosis not present

## 2022-08-01 LAB — CBC WITH DIFFERENTIAL/PLATELET
Abs Immature Granulocytes: 0.08 10*3/uL — ABNORMAL HIGH (ref 0.00–0.07)
Basophils Absolute: 0 10*3/uL (ref 0.0–0.1)
Basophils Relative: 0 %
Eosinophils Absolute: 0 10*3/uL (ref 0.0–0.5)
Eosinophils Relative: 0 %
HCT: 45.7 % (ref 39.0–52.0)
Hemoglobin: 16.1 g/dL (ref 13.0–17.0)
Immature Granulocytes: 1 %
Lymphocytes Relative: 6 %
Lymphs Abs: 1 10*3/uL (ref 0.7–4.0)
MCH: 30.2 pg (ref 26.0–34.0)
MCHC: 35.2 g/dL (ref 30.0–36.0)
MCV: 85.7 fL (ref 80.0–100.0)
Monocytes Absolute: 1.9 10*3/uL — ABNORMAL HIGH (ref 0.1–1.0)
Monocytes Relative: 11 %
Neutro Abs: 14.5 10*3/uL — ABNORMAL HIGH (ref 1.7–7.7)
Neutrophils Relative %: 82 %
Platelets: 219 10*3/uL (ref 150–400)
RBC: 5.33 MIL/uL (ref 4.22–5.81)
RDW: 13.7 % (ref 11.5–15.5)
WBC: 17.5 10*3/uL — ABNORMAL HIGH (ref 4.0–10.5)
nRBC: 0 % (ref 0.0–0.2)

## 2022-08-01 LAB — COMPREHENSIVE METABOLIC PANEL
ALT: 18 U/L (ref 0–44)
AST: 34 U/L (ref 15–41)
Albumin: 4.9 g/dL (ref 3.5–5.0)
Alkaline Phosphatase: 67 U/L (ref 38–126)
Anion gap: 17 — ABNORMAL HIGH (ref 5–15)
BUN: 12 mg/dL (ref 6–20)
CO2: 20 mmol/L — ABNORMAL LOW (ref 22–32)
Calcium: 10.2 mg/dL (ref 8.9–10.3)
Chloride: 103 mmol/L (ref 98–111)
Creatinine, Ser: 1.31 mg/dL — ABNORMAL HIGH (ref 0.61–1.24)
GFR, Estimated: >60 mL/min (ref >60)
Glucose, Bld: 111 mg/dL — ABNORMAL HIGH (ref 70–99)
Potassium: 3.6 mmol/L (ref 3.5–5.1)
Sodium: 140 mmol/L (ref 135–145)
Total Bilirubin: 0.6 mg/dL (ref 0.3–1.2)
Total Protein: 8.3 g/dL — ABNORMAL HIGH (ref 6.5–8.1)

## 2022-08-01 LAB — LIPASE, BLOOD: Lipase: 31 U/L (ref 11–51)

## 2022-08-01 MED ORDER — ONDANSETRON HCL 4 MG/2ML IJ SOLN
4.0000 mg | Freq: Once | INTRAMUSCULAR | Status: AC
  Administered 2022-08-01: 4 mg via INTRAVENOUS
  Filled 2022-08-01: qty 2

## 2022-08-01 MED ORDER — LACTATED RINGERS IV BOLUS
1000.0000 mL | Freq: Once | INTRAVENOUS | Status: AC
  Administered 2022-08-01: 1000 mL via INTRAVENOUS

## 2022-08-01 MED ORDER — LEVETIRACETAM IN NACL 1500 MG/100ML IV SOLN
1500.0000 mg | Freq: Once | INTRAVENOUS | Status: AC
  Administered 2022-08-01: 1500 mg via INTRAVENOUS
  Filled 2022-08-01: qty 100

## 2022-08-01 MED ORDER — ONDANSETRON 4 MG PO TBDP: 4.0000 mg | ORAL_TABLET | Freq: Three times a day (TID) | ORAL | 0 refills | Status: DC | PRN

## 2022-08-01 MED ORDER — ACETAMINOPHEN 500 MG PO TABS
1000.0000 mg | ORAL_TABLET | Freq: Once | ORAL | Status: AC
  Administered 2022-08-01: 1000 mg via ORAL
  Filled 2022-08-01: qty 2

## 2022-08-01 NOTE — ED Triage Notes (Signed)
Pt vomiting since yesterday, unable to hold down meds. Missed Keppra dose last night and this am. 4 seizures since 3am. Last one at 0950.

## 2022-08-01 NOTE — ED Provider Notes (Signed)
[place] EMERGENCY DEPARTMENT AT [facility] Provider Note  CSN: [id number] Arrival date & time: 08/01/22 1047  Chief Complaint(s) Emesis and Seizures  HPI CLEARANCE John King is a 27 y.o. male with PMH seizures on Keppra who presents emergency department for evaluation of vomiting and seizure.  Patient has had multiple episodes of vomiting starting yesterday and has been unable to keep his medications down including his Keppra.  He reportedly had multiple seizures starting at 3 AM this morning with latest seizure at 9:50 AM that self aborted with no medications.  Here in the emergency room, patient is somnolent and postictal.  He does awake to noxious stimuli and denies chest pain, shortness of breath, abdominal pain, headache, fever or other systemic symptoms.  He does endorse some mild persistent nausea.   Past Medical History Past Medical History:  Diagnosis Date   Seizures (HCC) 02-05-22   Patient Active Problem List   Diagnosis Date Noted   Seizure disorder (HCC) 05/03/2022   History of cold sores 05/03/2022   Cough 04/07/2016   Home Medication(s) Prior to Admission medications   Medication Sig Start Date End Date Taking? Authorizing Provider  ondansetron (ZOFRAN-ODT) 4 MG disintegrating tablet Take 1 tablet (4 mg total) by mouth every 8 (eight) hours as needed for nausea or vomiting. 08/01/22  Yes Lilah Mijangos, MD  clindamycin (CLINDAGEL) 1 % gel Apply topically 2 (two) times daily. 05/03/22   Jeani Sow, MD  doxycycline (VIBRA-TABS) 100 MG tablet Take 1 tablet (100 mg total) by mouth 2 (two) times daily. 05/03/22   Jeani Sow, MD  levETIRAcetam (KEPPRA) 1000 MG tablet Take 1 tablet twice a day 03/24/22   Van Clines, MD                                                                                                                                    Past Surgical History Past Surgical History:  Procedure Laterality Date   MOUTH SURGERY     Family  History Family History  Problem Relation Age of Onset   Cancer Mother 61       breast   Hypertension Mother    Hypertension Father    Alcohol abuse Father    Seizures Father        etoh withdrawal   CAD Other        50-60's. maternal side    Social History Social History   Tobacco Use   Smoking status: Every Day    Types: Cigars   Smokeless tobacco: Never  Vaping Use   Vaping Use: Some days  Substance Use Topics   Alcohol use: Yes    Alcohol/week: 7.0 standard drinks of alcohol    Types: 7 Shots of liquor per week    Comment: 2-3 drinks few times   Drug use: Yes    Types: Marijuana    Comment: Daily   Allergies Patient  has no known allergies.  Review of Systems Review of Systems  Gastrointestinal:  Positive for nausea and vomiting.  Neurological:  Positive for seizures.    Physical Exam Vital Signs  I have reviewed the triage vital signs BP 116/76   Pulse 93   Temp 97.9 F (36.6 C) (Oral)   Resp (!) 24   Ht 5\' 10"  (1.778 m)   Wt 63.5 kg   SpO2 93%   BMI 20.09 kg/m   Physical Exam Vitals and nursing note reviewed.  Constitutional:      General: He is not in acute distress.    Appearance: He is well-developed.  HENT:     Head: Normocephalic and atraumatic.  Eyes:     Conjunctiva/sclera: Conjunctivae normal.  Cardiovascular:     Rate and Rhythm: Normal rate and regular rhythm.     Heart sounds: No murmur heard. Pulmonary:     Effort: Pulmonary effort is normal. No respiratory distress.  Musculoskeletal:        General: No swelling.     Cervical back: Neck supple.  Skin:    General: Skin is warm and dry.  Neurological:     Mental Status: He is alert.  Psychiatric:        Mood and Affect: Mood normal.     ED Results and Treatments Labs (all labs ordered are listed, but only abnormal results are displayed) Labs Reviewed  COMPREHENSIVE METABOLIC PANEL - Abnormal; Notable for the following components:      Result Value   CO2 20 (*)     Glucose, Bld 111 (*)    Creatinine, Ser 1.31 (*)    Total Protein 8.3 (*)    Anion gap 17 (*)    All other components within normal limits  CBC WITH DIFFERENTIAL/PLATELET - Abnormal; Notable for the following components:   WBC 17.5 (*)    Neutro Abs 14.5 (*)    Monocytes Absolute 1.9 (*)    Abs Immature Granulocytes 0.08 (*)    All other components within normal limits  LIPASE, BLOOD                                                                                                                          Radiology CT Head Wo Contrast  Result Date: 08/01/2022 CLINICAL DATA:  27 year old male with vomiting. Multiple seizures since 0300 hours. EXAM: CT HEAD WITHOUT CONTRAST TECHNIQUE: Contiguous axial images were obtained from the base of the skull through the vertex without intravenous contrast. RADIATION DOSE REDUCTION: This exam was performed according to the departmental dose-optimization program which includes automated exposure control, adjustment of the mA and/or kV according to patient size and/or use of iterative reconstruction technique. COMPARISON:  Head CT 02/04/2022. FINDINGS: Brain: Normal cerebral volume. No midline shift, ventriculomegaly, mass effect, evidence of mass lesion, intracranial hemorrhage or evidence of cortically based acute infarction. Gray-white matter differentiation is within normal limits throughout the brain. Vascular: Stable.  No suspicious intracranial vascular hyperdensity.  Skull: Stable, negative. Sinuses/Orbits: Visualized paranasal sinuses and mastoids are clear. Other: Mildly Disconjugate gaze, otherwise negative orbit and scalp soft tissues. IMPRESSION: Stable and negative noncontrast Head CT. Electronically Signed   By: Odessa Fleming M.D.   On: 08/01/2022 11:39    Pertinent labs & imaging results that were available during my care of the patient were reviewed by me and considered in my medical decision making (see MDM for details).  Medications Ordered in  ED Medications  lactated ringers bolus 1,000 mL (0 mLs Intravenous Stopped 08/01/22 1510)  ondansetron (ZOFRAN) injection 4 mg (4 mg Intravenous Given 08/01/22 1324)  levETIRAcetam (KEPPRA) IVPB 1500 mg/ 100 mL premix (0 mg Intravenous Stopped 08/01/22 1402)  acetaminophen (TYLENOL) tablet 1,000 mg (1,000 mg Oral Given 08/01/22 1445)                                                                                                                                     Procedures Procedures  (including critical care time)  Medical Decision Making / ED Course   This patient presents to the ED for concern of seizures, this involves an extensive number of treatment options, and is a complaint that carries with it a high risk of complications and morbidity.  The differential diagnosis includes insert medication noncompliance, inability to take home medicines secondary to vomiting, posterior reversible encephalopathy syndrome, hyponatremia, convulsive syncope, focal lesion/mass, head trauma, intracerebral hemorrhage, toxins/recreational drugs, pseudoseizure  MDM: Patient seen emergency room for evaluation of seizure.  Physical exam with a small lip bite but bleeding controlled.  No focal motor or sensory deficits.  Laboratory evaluation with leukocytosis to 17.5, creatinine 1.31, glucose 111.  CT head unremarkable.  Patient loaded with Keppra, given Zofran for nausea and resuscitated.  Patient was observed for a total of 4 hours in the emergency department and did not have any additional seizure episodes.  On my reevaluation, patient mental status improving.  Patient presentation is consistent with a likely viral illness causing vomiting, and inability to tolerate home medications that likely precipitated his seizure.  Patient is able to tolerate p.o. in the ER without difficulty today and will be discharged with a prescription for ODT Zofran.  I have shared decision-making with the patient and multiple family  members who are also in agreement with this plan.  Patient will follow-up outpatient with his neurology team.  Patient given return precautions of which he and his family voiced understanding and he was discharged with outpatient neurology follow-up.   Additional history obtained: -Additional history obtained from multiple family members -External records from outside source obtained and reviewed including: Chart review including previous notes, labs, imaging, consultation notes   Lab Tests: -I ordered, reviewed, and interpreted labs.   The pertinent results include:   Labs Reviewed  COMPREHENSIVE METABOLIC PANEL - Abnormal; Notable for the following components:      Result Value   CO2 20 (*)  Glucose, Bld 111 (*)    Creatinine, Ser 1.31 (*)    Total Protein 8.3 (*)    Anion gap 17 (*)    All other components within normal limits  CBC WITH DIFFERENTIAL/PLATELET - Abnormal; Notable for the following components:   WBC 17.5 (*)    Neutro Abs 14.5 (*)    Monocytes Absolute 1.9 (*)    Abs Immature Granulocytes 0.08 (*)    All other components within normal limits  LIPASE, BLOOD         Imaging Studies ordered: I ordered imaging studies including CT head I independently visualized and interpreted imaging. I agree with the radiologist interpretation   Medicines ordered and prescription drug management: Meds ordered this encounter  Medications   lactated ringers bolus 1,000 mL   ondansetron (ZOFRAN) injection 4 mg   levETIRAcetam (KEPPRA) IVPB 1500 mg/ 100 mL premix   ondansetron (ZOFRAN-ODT) 4 MG disintegrating tablet    Sig: Take 1 tablet (4 mg total) by mouth every 8 (eight) hours as needed for nausea or vomiting.    Dispense:  20 tablet    Refill:  0   acetaminophen (TYLENOL) tablet 1,000 mg    -I have reviewed the patients home medicines and have made adjustments as needed  Critical interventions none   Cardiac Monitoring: The patient was maintained on a  cardiac monitor.  I personally viewed and interpreted the cardiac monitored which showed an underlying rhythm of: NSR  Social Determinants of Health:  Factors impacting patients care include: none   Reevaluation: After the interventions noted above, I reevaluated the patient and found that they have :improved  Co morbidities that complicate the patient evaluation  Past Medical History:  Diagnosis Date   Seizures (HCC) 02-05-22      Dispostion: I considered admission for this patient, but at this time patient does not meet inpatient criteria for admission he is safe for discharge with outpatient follow-up     Final Clinical Impression(s) / ED Diagnoses Final diagnoses:  Seizure [facility])     @PCDICTATION @    Glendora Score, MD 08/01/22 [phone]

## 2022-08-04 ENCOUNTER — Encounter: Payer: Self-pay | Admitting: Family Medicine

## 2022-08-04 ENCOUNTER — Ambulatory Visit (INDEPENDENT_AMBULATORY_CARE_PROVIDER_SITE_OTHER): Payer: Medicaid Other | Admitting: Family Medicine

## 2022-08-04 VITALS — BP 130/60 | HR 70 | Temp 99.6°F | Resp 18 | Ht 69.0 in | Wt 126.2 lb

## 2022-08-04 DIAGNOSIS — L738 Other specified follicular disorders: Secondary | ICD-10-CM | POA: Diagnosis not present

## 2022-08-04 DIAGNOSIS — G40909 Epilepsy, unspecified, not intractable, without status epilepticus: Secondary | ICD-10-CM

## 2022-08-04 DIAGNOSIS — D729 Disorder of white blood cells, unspecified: Secondary | ICD-10-CM

## 2022-08-04 DIAGNOSIS — Z79899 Other long term (current) drug therapy: Secondary | ICD-10-CM | POA: Diagnosis not present

## 2022-08-04 MED ORDER — DOXYCYCLINE HYCLATE 100 MG PO TABS: 100.0000 mg | ORAL_TABLET | Freq: Two times a day (BID) | ORAL | 1 refills | Status: DC

## 2022-08-04 NOTE — Patient Instructions (Signed)
Take care of self  Sent in doxycycline if needed for the cyst

## 2022-08-04 NOTE — Progress Notes (Signed)
   Subjective:     Patient ID: John King, male    DOB: [birth year], 27 y.o.   MRN: [id number]  Chief Complaint  Patient presents with   Follow-up    ED and Urgent Care follow-up from 08/01/22 for seizure, possible due to vomiting and not taking medications, had 2 seizures Saturday night      HPI  Sz-had sz on 5/5.  Had been doing well on Keppra, but then several episodes of vomiting so unable to keep Keppra down.  Had several sz so went to ER.  Had Ct-no change.  Got IVFLabs-creatinine 1.3, white blood cell(s) 17.5  Health Maintenance Due  Topic Date Due   DTaP/Tdap/Td (2 - Td or Tdap) 10/31/2016    Past Medical History:  Diagnosis Date   Seizures (HCC) 02-05-22    Past Surgical History:  Procedure Laterality Date   MOUTH SURGERY       Current Outpatient Medications:    clindamycin (CLINDAGEL) 1 % gel, Apply topically 2 (two) times daily., Disp: 30 g, Rfl: 4   levETIRAcetam (KEPPRA) 1000 MG tablet, Take 1 tablet twice a day, Disp: 60 tablet, Rfl: 11   doxycycline (VIBRA-TABS) 100 MG tablet, Take 1 tablet (100 mg total) by mouth 2 (two) times daily., Disp: 20 tablet, Rfl: 1  No Known Allergies ROS neg/noncontributory except as noted HPI/below      Objective:     BP 130/60   Pulse 70   Temp 99.6 F (37.6 C) (Temporal)   Resp 18   Ht 5\' 9"  (1.753 m)   Wt 126 lb 4 oz (57.3 kg)   SpO2 99%   BMI 18.64 kg/m  Wt Readings from Last 3 Encounters:  08/04/22 126 lb 4 oz (57.3 kg)  08/01/22 140 lb (63.5 kg)  05/03/22 137 lb (62.1 kg)    Physical Exam   Gen: WDWN NAD HEENT: NCAT, conjunctiva not injected, sclera nonicteric NECK:  supple, no thyromegaly, no nodes, no carotid bruits CARDIAC: RRR, S1S2+, no murmur. DP 2+B LUNGS: CTAB. No wheezes ABDOMEN:  BS+, soft, NTND, No HSM, no masses EXT:  no edema MSK: no gross abnormalities.  NEURO: A&O x3.  CN II-XII intact.  PSYCH: normal mood. Good eye contact Cyst right cheek/beard  Reviewed ER records      Assessment & Plan:  Seizure disorder (HCC)  Abnormal WBC count -     CBC+Platelet+Hem Review  High risk medication use -     TSH -     Comprehensive metabolic panel  Folliculitis barbae  Other orders -     Doxycycline Hyclate; Take 1 tablet (100 mg total) by mouth 2 (two) times daily.  Dispense: 20 tablet; Refill: 1  Sz disorder-chronic.  Usually controlled, but vomiting and unable to take Keppra so had breakthru.  Stable now.  S/pER-reviewed notes Elevated white blood cell(s)-has been, but more in ER(not sure if from illness or other) repeat High risk medications-check CMP,TSH Folliculitis barbae-chronic.  Continue clindagel, flared-doxycycline 100bid. Follow up as schedule in August  Angelena Sole, MD

## 2022-08-05 LAB — CBC+PLATELET+HEM REVIEW
MCV: 87 fL (ref 79–97)
WBC: 4.9 10*3/uL (ref 3.4–10.8)

## 2022-08-05 LAB — COMPREHENSIVE METABOLIC PANEL
ALT: 25 U/L (ref 0–53)
AST: 103 U/L — ABNORMAL HIGH (ref 0–37)
Albumin: 4.3 g/dL (ref 3.5–5.2)
Alkaline Phosphatase: 53 U/L (ref 39–117)
BUN: 10 mg/dL (ref 6–23)
CO2: 30 mEq/L (ref 19–32)
Calcium: 9.4 mg/dL (ref 8.4–10.5)
Chloride: 98 mEq/L (ref 96–112)
Creatinine, Ser: 0.88 mg/dL (ref 0.40–1.50)
GFR: 117.55 mL/min (ref >60.00)
Glucose, Bld: 90 mg/dL (ref 70–99)
Potassium: 3.8 mEq/L (ref 3.5–5.1)
Sodium: 138 mEq/L (ref 135–145)
Total Bilirubin: 0.7 mg/dL (ref 0.2–1.2)
Total Protein: 7 g/dL (ref 6.0–8.3)

## 2022-08-05 LAB — TSH: TSH: 0.61 u[IU]/mL (ref 0.35–5.50)

## 2022-08-08 NOTE — Progress Notes (Signed)
White blood cell(s) much better Liver tests elevated-pattern looks like alcohol.  How much does he drink?  Needs to repeat LFT in 1 month

## 2022-08-09 ENCOUNTER — Other Ambulatory Visit: Payer: Self-pay | Admitting: *Deleted

## 2022-08-09 DIAGNOSIS — R7989 Other specified abnormal findings of blood chemistry: Secondary | ICD-10-CM

## 2022-08-10 LAB — CBC+PLATELET+HEM REVIEW
BASO(ABSOLUTE): 0 10*3/uL (ref 0.0–0.2)
Basophils Manual: 1 %
EOS (ABSOLUTE VALUE): 0.1 10*3/uL (ref 0.0–0.4)
Eosinophils Manual: 2 %
Hematocrit: 43 % (ref 37.5–51.0)
Hemoglobin: 14.7 g/dL (ref 13.0–17.7)
MCH: 29.6 pg (ref 26.6–33.0)
MCHC: 34.2 g/dL (ref 31.5–35.7)
Monocytes(Absolute): 0.6 10*3/uL (ref 0.1–0.9)
Neutrophils Absolute: 2.3 10*3/uL (ref 1.4–7.0)
Neutrophils Manual: 47 %
PLATELET COMMENT: ADEQUATE
Platelets: 196 10*3/uL (ref 150–450)
RBC COMMENT: NORMAL
RBC: 4.96 x10E6/uL (ref 4.14–5.80)
RDW: 13 % (ref 11.6–15.4)

## 2022-08-13 ENCOUNTER — Encounter: Payer: Self-pay | Admitting: Neurology

## 2022-08-13 ENCOUNTER — Ambulatory Visit: Payer: Medicaid Other | Admitting: Neurology

## 2022-08-13 VITALS — BP 101/70 | HR 89 | Ht 69.0 in | Wt 129.4 lb

## 2022-08-13 DIAGNOSIS — R569 Unspecified convulsions: Secondary | ICD-10-CM | POA: Diagnosis not present

## 2022-08-13 MED ORDER — ZONISAMIDE 100 MG PO CAPS: ORAL_CAPSULE | ORAL | 6 refills | Status: DC

## 2022-08-13 NOTE — Progress Notes (Signed)
NEUROLOGY FOLLOW UP OFFICE NOTE  John King [id number] Apr 16, 1995  HISTORY OF PRESENT ILLNESS: I had the pleasure of seeing John King in follow-up in the neurology clinic on 08/13/2022.  The patient was last seen 5 months ago for seizures. He is accompanied by his girlfriend [name] who helps supplement the history today.  Records and images were personally reviewed where available. Since his last visit, they report a nocturnal seizure in February in the setting of missed medication. He bit his tongue and woke up feeling something was wrong. He was in the ER on 08/01/22 after having 4 seizures the night prior. He had been drinking alcohol all day Friday, hosted a party that evening, then ate a pizza which may have been undercooked. He then started to get sick and was vomiting all day until he had a seizure on 5/4. [name] reports he had 2 more in his sleep, they went to Urgent Care where he got a shot for the vomiting, then he had the 4th seizure when they got home. He was brought to the ER, he does not have much memory of this. His last memory was being in Urgent care, then waking up in his bed. He had bit his lip. No incontinence or focal weakness. [name] reports witnessing the seizures in December, he had staring/behavioral arrest followed by a convulsion. With the convulsions, he would have head deviation to the right. He is on Levetiracetam 1000mg  BID and feels moody/irritable and tired. No olfactory/gustatory hallucinations, focal numbness/tingling/weakness. He notices occasional body jerks, sometimes his neck/arm twitches/jumps. He has difficulty sleeping, up until 3am last night. He works Secretary/administrator. He states he drinks "too much" alcohol (liquor) on the weekends.     History on Initial Assessment 03/24/2022: This is a 27 year old right-handed man presenting for evaluation of new onset seizures. His mother is present to provide additional information. He was in his usual state of  health until 02/04/22. He recalls sleeping beside his 28 year old, woke up an hour later feeling fine and going back to sleep. He then woke up and saw the girl's mother with blood around the room. The child had called her mother, he apparently got up from bed, went to the bathroom, then as he came out he fell and had a seizure. He hit the left side of his head and then went back to bed but has no recollection of this. He had bit his tongue, urinary incontinence, and required staples for the head injury. He was brought to the ER where bloodwork showed a WBC of 12, EtOH 10. Head CT no acute changes, venous sinuses were noted to be mildly hyperdense, nonspecific but can be seen in the setting of dehydration. He then had another seizure on 03/15/22. He recalls feeling tired the day prior. He was visiting a friend who told him that something was off when he got there, he went to sleep then woke up to EMS around him. He was brought to [facility], per ER notes, girlfriend woke up to patient having a seizure in bed, he bit his tongue and had nausea/vomiting. Head CT noted a vague small focus of hypoattenuation in the right frontal lobe that is favored to be artifactual. Bloodwork normal. He was discharged home on Levetiracetam 750mg  BID.He reports missing 2 doses the day before Christmas then having another nocturnal seizure on 03/22/22. He again bit his tongue. He has some drowsiness with the Levetiracetam. He has occasional body twitches in his  head, neck, and back that he attributes to bulging discs. His legs would just jump uncontrollable sometimes, twitching and trembling. After a long day, his head may twitch uncontrollably. No associated confusion. This started around are 27 or 23. He has occasional episodes of deja vu. No staring/unresponsive episodes, focal numbness/tingling/weakness, olfactory/gustatory hallucinations. He used to have migraines but denies any more headaches. No dizziness, diplopia,  dysarthria/dysphagia, bowel/bladder dysfunction. He usually gets 7-9 hours of sleep. He drinks 2-3 liquor beverages daily. He smokes marijuana. He denies any alcohol prior to the first seizure but recalls having a drink the night before the second and third seizures. He lives with his mother. Mood is fine.   Epilepsy Risk Factors:  His mother had one seizure and takes Topiramate. HIs father had 2 alcohol-related seizures. He had a normal birth and early development.  There is no history of febrile convulsions, CNS infections such as meningitis/encephalitis, significant traumatic brain injury, neurosurgical procedures, or family history of seizures.  Prior ASMs: Levetiracetam (mood changes)   PAST MEDICAL HISTORY: Past Medical History:  Diagnosis Date   Seizures (HCC) 02-05-22    MEDICATIONS: Current Outpatient Medications on File Prior to Visit  Medication Sig Dispense Refill   clindamycin (CLINDAGEL) 1 % gel Apply topically 2 (two) times daily. 30 g 4   levETIRAcetam (KEPPRA) 1000 MG tablet Take 1 tablet twice a day 60 tablet 11   doxycycline (VIBRA-TABS) 100 MG tablet Take 1 tablet (100 mg total) by mouth 2 (two) times daily. (Patient not taking: Reported on 08/13/2022) 20 tablet 1   No current facility-administered medications on file prior to visit.    ALLERGIES: No Known Allergies  FAMILY HISTORY: Family History  Problem Relation Age of Onset   Cancer Mother 61       breast   Hypertension Mother    Hypertension Father    Alcohol abuse Father    Seizures Father        etoh withdrawal   CAD Other        50-60's. maternal side    SOCIAL HISTORY: Social History   Socioeconomic History   Marital status: Single    Spouse name: Not on file   Number of children: 0   Years of education: Not on file   Highest education level: Not on file  Occupational History   Not on file  Tobacco Use   Smoking status: Every Day    Types: Cigars   Smokeless tobacco: Never  Vaping Use    Vaping Use: Every day  Substance and Sexual Activity   Alcohol use: Yes    Alcohol/week: 7.0 standard drinks of alcohol    Types: 7 Shots of liquor per week    Comment: 2-3 drinks few times   Drug use: Yes    Types: Marijuana    Comment: Daily   Sexual activity: Yes    Birth control/protection: None  Other Topics Concern   Not on file  Social History Narrative   Unem.   Are you right handed or left handed? Right    Are you currently employed ? No    What is your current occupation?   Do you live at home alone? no   Who lives with you? With family    What type of home do you live in: 1 story or 2 story?  15 steps        Social Determinants of Health   Financial Resource Strain: Not on file  Food Insecurity: Not on  file  Transportation Needs: Not on file  Physical Activity: Not on file  Stress: Not on file  Social Connections: Not on file  Intimate Partner Violence: Not on file     PHYSICAL EXAM: Vitals:   08/13/22 1406  BP: 101/70  Pulse: 89  SpO2: 99%   General: No acute distress Head:  Normocephalic/atraumatic Skin/Extremities: No rash, no edema Neurological Exam: alert and awake. No aphasia or dysarthria. Fund of knowledge is appropriate.   Attention and concentration are normal.   Cranial nerves: Pupils equal, round. Extraocular movements intact with no nystagmus. Visual fields full.  No facial asymmetry.  Motor: Bulk and tone normal, muscle strength 5/5 throughout with no pronator drift.   Finger to nose testing intact.  Gait narrow-based and steady, able to tandem walk adequately.  Romberg negative.   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 He mostly has nocturnal seizures. Last seizure was 08/01/2022. He is having side effects on Levetiracetam, we discussed switching to Zonisamide, side effects discussed. Start Zonisamide 100mg  qhs x 2  weeks, then increase to 200mg  qhs x 2 weeks, then to 300mg  qhs. Continue Levetiracetam 1000mg  BID as he uptitrates Zonisamide, then wean off as instructed. Proceed with brain MRI with and without contrast and 1-hour EEG as previously discussed for seizure characterization. He is aware of [place] driving laws to stop driving until 6 months seizure-free. Follow-up in 3-4 months, call for any changes.    Thank you for allowing me to participate in his care.  Please do not hesitate to call for any questions or concerns.    Patrcia Dolly, M.D.   CC: Dr. Ruthine Dose

## 2022-08-13 NOTE — Patient Instructions (Signed)
Good to meet you.  Schedule MRI brain with and without contrast  2. Schedule 1-hour EEG  3. Start Zonisamide 100mg : Take 1 capsule every night for 2 weeks, then increase to 2 capsules every night for 2 weeks, then increase to 3 capsules every night and continue  4. Continue Keppra 1000mg  twice a day for another 6 weeks as you start new medication. Once you are taking 3 caps of the new medication, reduce Keppra to 1/2 tablet twice a day for 1 week, then reduce to 1/2 tablet daily for 1 week, then stop Keppra  5. Follow-up in 3-4 months, call for any changes   Seizure Precautions: 1. If medication has been prescribed for you to prevent seizures, take it exactly as directed.  Do not stop taking the medicine without talking to your doctor first, even if you have not had a seizure in a long time.   2. Avoid activities in which a seizure would cause danger to yourself or to others.  Don't operate dangerous machinery, swim alone, or climb in high or dangerous places, such as on ladders, roofs, or girders.  Do not drive unless your doctor says you may.  3. If you have any warning that you may have a seizure, lay down in a safe place where you can't hurt yourself.    4.  No driving for 6 months from last seizure, as per [facility].   Please refer to the following link on the Epilepsy Foundation of America's website for more information: http://www.epilepsyfoundation.org/answerplace/Social/driving/drivingu.cfm   5.  Maintain good sleep hygiene. Continue working on reducing and eventually stopping alcohol.  6.  Contact your doctor if you have any problems that may be related to the medicine you are taking.  7.  Call 911 and bring the patient back to the ED if:        A.  The seizure lasts longer than 5 minutes.       B.  The patient doesn't awaken shortly after the seizure  C.  The patient has new problems such as difficulty seeing, speaking or moving  D.  The patient was injured  during the seizure  E.  The patient has a temperature over 102 F (39C)  F.  The patient vomited and now is having trouble breathing

## 2022-08-20 ENCOUNTER — Ambulatory Visit: Payer: Medicaid Other | Admitting: Neurology

## 2022-08-20 DIAGNOSIS — R569 Unspecified convulsions: Secondary | ICD-10-CM | POA: Diagnosis not present

## 2022-08-20 NOTE — Progress Notes (Signed)
EEG complete - results pending 

## 2022-09-01 NOTE — Procedures (Signed)
ELECTROENCEPHALOGRAM REPORT  Date of Study: 08/20/2022  Patient's Name: John King MRN: [id number] Date of Birth: 03-20-1996  Referring Provider: Dr. Patrcia Dolly  Clinical History: This is a 27 year old man with nocturnal convulsions and episodes of staring/behavioral arrest preceding convulsions. EEG for classification.  Medications: Zonisamide, Keppra  Technical Summary: A multichannel digital 1-hour EEG recording measured by the international 10-20 system with electrodes applied with paste and impedances below 5000 ohms performed in our laboratory with EKG monitoring in an awake and asleep patient.  Hyperventilation and photic stimulation were performed.  The digital EEG was referentially recorded, reformatted, and digitally filtered in a variety of bipolar and referential montages for optimal display.    Description: The patient is awake and asleep during the recording.  During maximal wakefulness, there is a symmetric, medium voltage 9-10 Hz posterior dominant rhythm that attenuates with eye opening.  The record is symmetric.  During drowsiness and sleep, there is an increase in theta slowing of the background.  Vertex waves and symmetric sleep spindles were seen. Hyperventilation and photic stimulation did not elicit any abnormalities.  There were no epileptiform discharges or electrographic seizures seen.    EKG lead was unremarkable.  Impression: This 1-hour awake and asleep EEG is normal.    Clinical Correlation: A normal EEG does not exclude a clinical diagnosis of epilepsy.  If further clinical questions remain, prolonged EEG may be helpful.  Clinical correlation is advised.   Patrcia Dolly, M.D.

## 2022-09-02 ENCOUNTER — Telehealth: Payer: Self-pay

## 2022-09-02 NOTE — Telephone Encounter (Signed)
-----   Message from Van Clines, MD sent at 09/01/2022  3:36 PM EDT ----- Pls let him know the EEG is normal, however it is not like a pregnancy test that is positive or negative, just a snapshot of his brain waves. Proceed with brain MRI as scheduled. Continue Zonisamide. Thanks

## 2022-09-02 NOTE — Telephone Encounter (Signed)
Pt called an informed that the EEG is normal, however it is not like a pregnancy test that is positive or negative, just a snapshot of his brain waves. Proceed with brain MRI as scheduled. Continue Zonisamide.

## 2022-09-10 ENCOUNTER — Other Ambulatory Visit (INDEPENDENT_AMBULATORY_CARE_PROVIDER_SITE_OTHER): Payer: 59

## 2022-09-10 DIAGNOSIS — R7989 Other specified abnormal findings of blood chemistry: Secondary | ICD-10-CM

## 2022-09-10 LAB — HEPATIC FUNCTION PANEL
ALT: 8 U/L (ref 0–53)
AST: 16 U/L (ref 0–37)
Albumin: 4.4 g/dL (ref 3.5–5.2)
Alkaline Phosphatase: 58 U/L (ref 39–117)
Bilirubin, Direct: 0.1 mg/dL (ref 0.0–0.3)
Total Bilirubin: 0.5 mg/dL (ref 0.2–1.2)
Total Protein: 7.2 g/dL (ref 6.0–8.3)

## 2022-09-11 NOTE — Progress Notes (Signed)
Better.  Needs to avoid alcohol

## 2022-09-13 ENCOUNTER — Encounter: Payer: Self-pay | Admitting: Neurology

## 2022-09-16 ENCOUNTER — Ambulatory Visit
Admission: RE | Admit: 2022-09-16 | Discharge: 2022-09-16 | Disposition: A | Discharge status: Home / Self Care | Payer: Medicaid Other | Source: Ambulatory Visit | Attending: Neurology | Admitting: Neurology

## 2022-09-16 DIAGNOSIS — R569 Unspecified convulsions: Secondary | ICD-10-CM

## 2022-09-16 MED ORDER — GADOPICLENOL 0.5 MMOL/ML IV SOLN
6.0000 mL | Freq: Once | INTRAVENOUS | Status: AC | PRN
  Administered 2022-09-16: 6 mL via INTRAVENOUS

## 2022-09-29 ENCOUNTER — Encounter: Payer: Self-pay | Admitting: Family Medicine

## 2022-10-01 ENCOUNTER — Telehealth: Payer: Self-pay

## 2022-10-01 ENCOUNTER — Telehealth: Payer: Self-pay | Admitting: Neurology

## 2022-10-01 MED ORDER — BRIVARACETAM 100 MG PO TABS: ORAL_TABLET | ORAL | 5 refills | Status: DC

## 2022-10-01 NOTE — Telephone Encounter (Signed)
Pt called an informed that dr Karel Jarvis said Sorry to hear this. We cannot stop the Zonisamide suddenly, but we can start a different medication that hopefully will not cause same side effect. We will start Briviact: take 100mg  every night for 1 week, then increase to 100mg  BID. I sent in the Rx. He will have to wean off the Zonisamide: take 2 capsules every night for 1 week, then 1 capsule every night for 1 week, then stop. Briviact can sometimes cause drowsiness and dizziness, does not typically cause mood changes so hopefully he tolerates it better.

## 2022-10-01 NOTE — Telephone Encounter (Signed)
Pt left a VM stating that he has questions about the medication side effects  he was on Keppra and now he is on Zonegran  please call

## 2022-10-01 NOTE — Telephone Encounter (Signed)
-----   Message from Van Clines, MD sent at 09/29/2022  3:58 PM EDT ----- Pls let him know MRI brain is normal, no tumor, stroke, or bleed. Continue Zonisamide as prescribed. How is he feeling? Thanks

## 2022-10-01 NOTE — Telephone Encounter (Signed)
Pt called no answer as Stated in DPR vocemail left that MRI brain is normal, no tumor, stroke, or bleed. Continue Zonisamide as prescribed.

## 2022-10-01 NOTE — Telephone Encounter (Signed)
Sorry to hear this. We cannot stop the Zonisamide suddenly, but we can start a different medication that hopefully will not cause same side effect. We will start Briviact: take 100mg  every night for 1 week, then increase to 100mg  BID. I sent in the Rx. He will have to wean off the Zonisamide: take 2 capsules every night for 1 week, then 1 capsule every night for 1 week, then stop. Briviact can sometimes cause drowsiness and dizziness, does not typically cause mood changes so hopefully he tolerates it better.

## 2022-10-01 NOTE — Telephone Encounter (Signed)
Pt stated that he is off the Keppra now and taken 3 capsules of the zonisamide and he is having waves of emotions he breaks down and cries everyday he wants to go back on the Keppra or something else, he can't take all the crying,

## 2022-10-05 ENCOUNTER — Telehealth: Payer: Self-pay

## 2022-10-05 ENCOUNTER — Other Ambulatory Visit: Payer: Self-pay

## 2022-10-05 MED ORDER — TOPIRAMATE 25 MG PO TABS: ORAL_TABLET | ORAL | 1 refills | Status: DC

## 2022-10-05 NOTE — Telephone Encounter (Signed)
Pt called informed that we can do Topiramate instead. Dr Karel Jarvis wanted pt to know that Topiramate is a cousin of Zonisamide, they have similar properties, but we can see if he tolerates Topiramate better.  Rx sent in for Topiramate 25mg  1 tab BID for 1 week, then increase to 2 tabs BID. Side effects may include numbness/tingling in hands and feet, carbonated drinks like soda can taste different. There is a less than 1% (low risk) of kidney stones, so he needs to keep hydrated pt verbalized understanding,

## 2022-10-05 NOTE — Telephone Encounter (Signed)
Can you pls call him and let him know we can do Topiramate instead. I do want him to know that Topiramate is a cousin of Zonisamide, they have similar properties, but we can see if he tolerates Topiramate better. Pls send in Rx for Topiramate 25mg  1 tab BID for 1 week, then increase to 2 tabs BID. Side effects may include numbness/tingling in hands and feet, carbonated drinks like soda can taste different. There is a less than 1% (low risk) of kidney stones, so he needs to keep hydrated. Thanks

## 2022-10-06 ENCOUNTER — Telehealth: Payer: Self-pay | Admitting: Neurology

## 2022-10-06 NOTE — Telephone Encounter (Signed)
My chart message sent to dr Karel Jarvis

## 2022-10-06 NOTE — Telephone Encounter (Signed)
My chart message sent to the Dr

## 2022-10-06 NOTE — Telephone Encounter (Signed)
Patients needs to speak with heather about dosage of his new medication he was put on

## 2022-10-06 NOTE — Telephone Encounter (Signed)
Patient called wanting to speak with nurse about medication change concerns/KB

## 2022-11-01 ENCOUNTER — Encounter: Payer: Self-pay | Admitting: Family Medicine

## 2022-11-01 ENCOUNTER — Ambulatory Visit (INDEPENDENT_AMBULATORY_CARE_PROVIDER_SITE_OTHER): Payer: 59 | Admitting: Family Medicine

## 2022-11-01 VITALS — BP 102/60 | HR 64 | Temp 98.4°F | Resp 18 | Ht 69.0 in | Wt 124.1 lb

## 2022-11-01 DIAGNOSIS — G40909 Epilepsy, unspecified, not intractable, without status epilepticus: Secondary | ICD-10-CM

## 2022-11-01 DIAGNOSIS — F4323 Adjustment disorder with mixed anxiety and depressed mood: Secondary | ICD-10-CM | POA: Diagnosis not present

## 2022-11-01 DIAGNOSIS — R634 Abnormal weight loss: Secondary | ICD-10-CM | POA: Diagnosis not present

## 2022-11-01 NOTE — Progress Notes (Signed)
   Subjective:     Patient ID: John King, male    DOB: Jul 01, 1995, 27 y.o.   MRN: [id number]  Chief Complaint  Patient presents with   Medical Management of Chronic Issues    6 month follow-up on sz Discuss possible depression     HPI  Sz disorder-taking topamax.  Working w/neuro.  Has appt in Sept.  Keppra caused irrit, zonasamide-emotional/crying/SI.   Wt loss- 02/2022-was 140#.  Not eating well. Cost, other problems, forgets. Depression-for about 1 month.    Health Maintenance Due  Topic Date Due   DTaP/Tdap/Td (2 - Td or Tdap) 10/31/2016    Past Medical History:  Diagnosis Date   Seizures (HCC) 02-05-22    Past Surgical History:  Procedure Laterality Date   MOUTH SURGERY       Current Outpatient Medications:    clindamycin (CLINDAGEL) 1 % gel, Apply topically 2 (two) times daily., Disp: 30 g, Rfl: 4   topiramate (TOPAMAX) 25 MG tablet, 1 tab BID for 1 week, then increase to 2 tabs BID., Disp: 120 tablet, Rfl: 1  No Known Allergies ROS neg/noncontributory except as noted HPI/below Nervous at times and chest tight-for 1 month.        Objective:     BP 102/60   Pulse 64   Temp 98.4 F (36.9 C) (Temporal)   Resp 18   Ht 5\' 9"  (1.753 m)   Wt 124 lb 2 oz (56.3 kg)   SpO2 98%   BMI 18.33 kg/m  Wt Readings from Last 3 Encounters:  11/01/22 124 lb 2 oz (56.3 kg)  08/13/22 129 lb 6.4 oz (58.7 kg)  08/04/22 126 lb 4 oz (57.3 kg)    Physical Exam   Gen: WDWN NAD HEENT: NCAT, conjunctiva not injected, sclera nonicteric NECK:  supple, no thyromegaly, no nodes, no carotid bruits CARDIAC: RRR, S1S2+, no murmur. DP 2+B LUNGS: CTAB. No wheezes ABDOMEN:  BS+, soft, NTND, No HSM, no masses EXT:  no edema MSK: no gross abnormalities.  NEURO: A&O x3.  CN II-XII intact.  PSYCH: tearful. Good eye contact     Assessment & Plan:  Seizure disorder (HCC)  Adjustment disorder with mixed anxiety and depressed mood  Weight loss, unintentional   Seizure  disorder-meds changed d/t side effects.  Managed by neuro. Weight loss-unintentional-pt will be more aware and eat better.  F/u 1 mo Adjustment disorder-mixed.  Brought on initially by meds and then reflections of medical/social issues.  Advised behavioral health hospital if acute.  Pt declines meds.  Will seek counseling.  Will refer to managed medicaid for other resources, etc.     Return in about 4 weeks (around 11/29/2022) for wt.  Angelena Sole, MD

## 2022-11-01 NOTE — Patient Instructions (Signed)
It was very nice to see you today!  Eat better   PLEASE NOTE:  If you had any lab tests please let us know if you have not heard back within a few days. You may see your results on MyChart before we have a chance to review them but we will give you a call once they are reviewed by [place]. If we ordered any referrals today, please let us know if you have not heard from their office within the next week.   Please try these tips to maintain a healthy lifestyle:  Eat most of your calories during the day when you are active. Eliminate processed foods including packaged sweets (pies, cakes, cookies), reduce intake of potatoes, white bread, white pasta, and white rice. Look for whole grain options, oat flour or almond flour.  Each meal should contain half fruits/vegetables, one quarter protein, and one quarter carbs (no bigger than a computer mouse).  Cut down on sweet beverages. This includes juice, soda, and sweet tea. Also watch fruit intake, though this is a healthier sweet option, it still contains natural sugar! Limit to 3 servings daily.  Drink at least 1 glass of water with each meal and aim for at least 8 glasses per day  Exercise at least 150 minutes every week.

## 2022-11-11 ENCOUNTER — Encounter (INDEPENDENT_AMBULATORY_CARE_PROVIDER_SITE_OTHER): Payer: Self-pay

## 2022-11-11 ENCOUNTER — Other Ambulatory Visit (HOSPITAL_COMMUNITY)
Admission: RE | Admit: 2022-11-11 | Discharge: 2022-11-11 | Disposition: A | Discharge status: Home / Self Care | Payer: 59 | Source: Ambulatory Visit | Attending: Physician Assistant | Admitting: Physician Assistant

## 2022-11-11 ENCOUNTER — Ambulatory Visit (INDEPENDENT_AMBULATORY_CARE_PROVIDER_SITE_OTHER): Payer: 59 | Admitting: Physician Assistant

## 2022-11-11 ENCOUNTER — Encounter: Payer: Self-pay | Admitting: Physician Assistant

## 2022-11-11 VITALS — BP 128/70 | HR 80 | Temp 97.8°F | Ht 69.0 in | Wt 126.5 lb

## 2022-11-11 DIAGNOSIS — Z202 Contact with and (suspected) exposure to infections with a predominantly sexual mode of transmission: Secondary | ICD-10-CM

## 2022-11-11 MED ORDER — DOXYCYCLINE HYCLATE 100 MG PO TABS: 100.0000 mg | ORAL_TABLET | Freq: Two times a day (BID) | ORAL | 0 refills | Status: DC

## 2022-11-11 NOTE — Patient Instructions (Signed)
It was great to see you!  I've sent in the treatment for chlamydia (doxycycline) should you decide to start this Please do not have sex until we get your results and start any appropriate treatment  Take care,  Jarold Motto PA-C

## 2022-11-11 NOTE — Progress Notes (Signed)
John King is a 27 y.o. male here for a new problem.  History of Present Illness:   Chief Complaint  Patient presents with   STD testing    Pt is wanting STD testing, partner was Dx with Chlamydia. Denies discharge.    HPI  STD Testing: Requests STD testing after his partner diagnosed with Chlamydia on 8/13. She is currently hospitalized with what sounds like PID. States he has had intercourse with multiple partners this year, none with positive diagnoses until 8/13.  Denies experiencing symptoms but has recently had intercourse with his partner.   History of past STD approx 4-5 years ago that was treated.    Past Medical History:  Diagnosis Date   Seizures (HCC) 02-05-22     Social History   Tobacco Use   Smoking status: Every Day    Types: Cigars   Smokeless tobacco: Never  Vaping Use   Vaping status: Every Day  Substance Use Topics   Alcohol use: Yes    Alcohol/week: 7.0 standard drinks of alcohol    Types: 7 Shots of liquor per week    Comment: 2-3 on weekends   Drug use: Yes    Types: Marijuana    Comment: Daily    Past Surgical History:  Procedure Laterality Date   MOUTH SURGERY      Family History  Problem Relation Age of Onset   Seizures Mother        one only in HS   Cancer Mother 55       breast   Hypertension Mother    Hypertension Father    Alcohol abuse Father    Seizures Father        etoh withdrawal   CAD Other        50-60's. maternal side    No Known Allergies  Current Medications:   Current Outpatient Medications:    clindamycin (CLINDAGEL) 1 % gel, Apply topically 2 (two) times daily., Disp: 30 g, Rfl: 4   doxycycline (VIBRA-TABS) 100 MG tablet, Take 1 tablet (100 mg total) by mouth 2 (two) times daily., Disp: 14 tablet, Rfl: 0   topiramate (TOPAMAX) 25 MG tablet, 1 tab BID for 1 week, then increase to 2 tabs BID., Disp: 120 tablet, Rfl: 1   Review of Systems:   ROS Negative unless otherwise specified per  HPI.  Vitals:   Vitals:   11/11/22 0809  BP: 128/70  Pulse: 80  Temp: 97.8 F (36.6 C)  TempSrc: Temporal  SpO2: 99%  Weight: 126 lb 8 oz (57.4 kg)  Height: 5\' 9"  (1.753 m)     Body mass index is 18.68 kg/m.  Physical Exam:   Physical Exam Vitals and nursing note reviewed.  Constitutional:      General: He is not in acute distress.    Appearance: He is well-developed. He is not ill-appearing or toxic-appearing.  Cardiovascular:     Rate and Rhythm: Normal rate and regular rhythm.     Pulses: Normal pulses.     Heart sounds: Normal heart sounds, S1 normal and S2 normal.  Pulmonary:     Effort: Pulmonary effort is normal.     Breath sounds: Normal breath sounds.  Skin:    General: Skin is warm and dry.  Neurological:     Mental Status: He is alert.     GCS: GCS eye subscore is 4. GCS verbal subscore is 5. GCS motor subscore is 6.  Psychiatric:  Speech: Speech normal.        Behavior: Behavior normal. Behavior is cooperative.     Assessment and Plan:   STD exposure No red flags on discussion  Discussed starting empiric treatment(s) vs waiting -- he would like to wait for testing results until treatment(s) at this time however he would like prescription sent in now in case he changes his mind Advised against all sexual intercourse until testing returns and appropriate treatment(s) administered   I,Emily Lagle,acting as a scribe for [employer], PA.,have documented all relevant documentation on the behalf of Jarold Motto, PA,as directed by  Jarold Motto, PA while in the presence of Jarold Motto, [place].  I, Jarold Motto, [place], have reviewed all documentation for this visit. The documentation on 11/11/22 for the exam, diagnosis, procedures, and orders are all accurate and complete.]  Jarold Motto, PA-C

## 2022-11-12 ENCOUNTER — Encounter: Payer: Self-pay | Admitting: Physician Assistant

## 2022-11-12 LAB — HIV ANTIBODY (ROUTINE TESTING W REFLEX): HIV 1&2 Ab, 4th Generation: NONREACTIVE

## 2022-11-12 LAB — URINE CYTOLOGY ANCILLARY ONLY
Chlamydia: NEGATIVE
Comment: NEGATIVE
Comment: NEGATIVE
Comment: NORMAL
Neisseria Gonorrhea: NEGATIVE
Trichomonas: NEGATIVE

## 2022-11-12 LAB — RPR: RPR Ser Ql: NONREACTIVE

## 2022-11-18 ENCOUNTER — Telehealth: Payer: Self-pay | Admitting: *Deleted

## 2022-11-18 NOTE — Progress Notes (Signed)
  Care Coordination  Outreach Note  11/18/2022 Name: John King MRN: [id number] DOB: 1995/05/05   Care Coordination Outreach Attempts: A third unsuccessful outreach was attempted today to offer the patient with information about available care coordination services.  Follow Up Plan:  No further outreach attempts will be made at this time. We have been unable to contact the patient to offer or enroll patient in care coordination services  Encounter Outcome:  No Answer  Christie Nottingham  Care Coordination Care Guide  Direct Dial: [phone]

## 2022-11-21 ENCOUNTER — Encounter: Payer: Self-pay | Admitting: Family Medicine

## 2022-11-22 ENCOUNTER — Encounter: Payer: Self-pay | Admitting: Family Medicine

## 2022-11-25 ENCOUNTER — Encounter: Payer: Self-pay | Admitting: Neurology

## 2022-11-25 ENCOUNTER — Encounter: Payer: Self-pay | Admitting: Family Medicine

## 2022-11-25 MED ORDER — TOPIRAMATE 100 MG PO TABS
100.0000 mg | ORAL_TABLET | Freq: Two times a day (BID) | ORAL | 6 refills | Status: DC
Start: 2022-11-25 — End: 2023-04-04

## 2022-11-30 ENCOUNTER — Encounter: Payer: Self-pay | Admitting: Family Medicine

## 2022-11-30 ENCOUNTER — Ambulatory Visit (INDEPENDENT_AMBULATORY_CARE_PROVIDER_SITE_OTHER): Payer: 59 | Admitting: Family Medicine

## 2022-11-30 VITALS — BP 110/61 | HR 61 | Temp 98.4°F | Resp 16 | Ht 69.0 in | Wt 130.4 lb

## 2022-11-30 DIAGNOSIS — R634 Abnormal weight loss: Secondary | ICD-10-CM | POA: Diagnosis not present

## 2022-11-30 NOTE — Progress Notes (Signed)
   Subjective:     Patient ID: John King, male    DOB: [birth year], 27 y.o.   MRN: [id number]  Chief Complaint  Patient presents with   Follow-up    Follow-up on weight    HPI wt check-doing better.  Stressed. Working on counseling.  No SI Sz-stressed.  Topamax increased.   Pt playing phone tag w/Medicaid SW.  Health Maintenance Due  Topic Date Due   DTaP/Tdap/Td (2 - Td or Tdap) 10/31/2016    Past Medical History:  Diagnosis Date   Seizures (HCC) 02-05-22    Past Surgical History:  Procedure Laterality Date   MOUTH SURGERY       Current Outpatient Medications:    clindamycin (CLINDAGEL) 1 % gel, Apply topically 2 (two) times daily., Disp: 30 g, Rfl: 4   topiramate (TOPAMAX) 100 MG tablet, Take 1 tablet (100 mg total) by mouth 2 (two) times daily., Disp: 60 tablet, Rfl: 6   doxycycline (VIBRA-TABS) 100 MG tablet, Take 1 tablet (100 mg total) by mouth 2 (two) times daily. (Patient not taking: Reported on 11/30/2022), Disp: 14 tablet, Rfl: 0  No Known Allergies ROS neg/noncontributory except as noted HPI/below      Objective:     BP 110/61   Pulse 61   Temp 98.4 F (36.9 C) (Temporal)   Resp 16   Ht 5\' 9"  (1.753 m)   Wt 130 lb 6 oz (59.1 kg)   SpO2 99%   BMI 19.25 kg/m  Wt Readings from Last 3 Encounters:  11/30/22 130 lb 6 oz (59.1 kg)  11/11/22 126 lb 8 oz (57.4 kg)  11/01/22 124 lb 2 oz (56.3 kg)    Physical Exam   Gen: WDWN NAD HEENT: NCAT, conjunctiva not injected, sclera nonicteric MSK: no gross abnormalities.  NEURO: A&O x3.  CN II-XII intact.  PSYCH: normal mood. Good eye contact     Assessment & Plan:  Weight loss, unintentional   Wt loss-pt eating better.  Gained wt.  Continue.    Return in about 3 months (around 03/01/2023) for sz.  Angelena Sole, MD

## 2022-11-30 NOTE — Patient Instructions (Signed)

## 2022-12-03 ENCOUNTER — Telehealth: Payer: 59 | Admitting: Neurology

## 2022-12-03 ENCOUNTER — Encounter: Payer: Self-pay | Admitting: Neurology

## 2022-12-03 VITALS — Ht 70.0 in | Wt 130.0 lb

## 2022-12-03 DIAGNOSIS — G40009 Localization-related (focal) (partial) idiopathic epilepsy and epileptic syndromes with seizures of localized onset, not intractable, without status epilepticus: Secondary | ICD-10-CM

## 2022-12-03 MED ORDER — VALTOCO 15 MG DOSE 7.5 MG/0.1ML NA LQPK: NASAL | 5 refills | Status: DC

## 2022-12-03 NOTE — Progress Notes (Signed)
Virtual Visit via Video Note The purpose of this virtual visit is to provide medical care while limiting exposure to the novel coronavirus.    Consent was obtained for video visit:  Yes.   Answered questions that patient had about telehealth interaction:  Yes.   I discussed the limitations, risks, security and privacy concerns of performing an evaluation and management service by telemedicine. I also discussed with the patient that there may be a patient responsible charge related to this service. The patient expressed understanding and agreed to proceed.  Pt location: Home Physician Location: office Name of referring provider:  Jeani Sow, MD I connected with John King at patients initiation/request on 12/03/2022 at  3:30 PM EDT by video enabled telemedicine application and verified that I am speaking with the correct person using two identifiers. Pt MRN:  [id number] Pt DOB:  11/29/1995 Video Participants:  John King; Victory Dakin (mother)   History of Present Illness:  The patient had a virtual video visit on 12/03/2022. He was last seen 4 months ago for recurrent seizures. His mother is present to provide additional information. Records were reviewed. I personally reviewed brain MRI with and without contrast done 08/2022 which was normal, hippocampi symmetric with no abnormal signal or enhancement seen. His 1-hour EEG in 07/2022 was normal. On his last visit, he reported side effects on Levetiracetam and was switched to Zonisamide. Unfortunately this also caused mood changes, he was having crying spells on a daily basis. We discussed switching to Briviact, however he asked if he could take Topiramate since his mother takes this medication. He was started on Topiramate in 09/2022, he contacted our office about another seizure that occurred on 11/24/22. We discussed increasing Topiramate to 100mg  BID. He states that prior to increasing dose, he had another seizure on 12/01/22,  his mother witnessed the seizure, she had a thumping noise twice and then found him upstairs standing at the head of the bed with facial twitching then head turn to the left. He then fell to the bed with a convulsion lasting 1.5-2 minutes. He had urinary incontinence and it took a long time for him to return to baseline.   He reports feeling unwell, vomiting for the past 2 days. His mother feels the seizure last Wednesday was due to inability to keep his medication down. He was able to eat more yesterday then this morning had stomach upset again. He denies any side effects on Topiramate.    History on Initial Assessment 03/24/2022: This is a 27 year old right-handed man presenting for evaluation of new onset seizures. His mother is present to provide additional information. He was in his usual state of health until 02/04/22. He recalls sleeping beside his 28 year old, woke up an hour later feeling fine and going back to sleep. He then woke up and saw the girl's mother with blood around the room. The child had called her mother, he apparently got up from bed, went to the bathroom, then as he came out he fell and had a seizure. He hit the left side of his head and then went back to bed but has no recollection of this. He had bit his tongue, urinary incontinence, and required staples for the head injury. He was brought to the ER where bloodwork showed a WBC of 12, EtOH 10. Head CT no acute changes, venous sinuses were noted to be mildly hyperdense, nonspecific but can be seen in the setting of dehydration. He then  had another seizure on 03/15/22. He recalls feeling tired the day prior. He was visiting a friend who told him that something was off when he got there, he went to sleep then woke up to EMS around him. He was brought to [facility], per ER notes, girlfriend woke up to patient having a seizure in bed, he bit his tongue and had nausea/vomiting. Head CT noted a vague small focus of hypoattenuation in the  right frontal lobe that is favored to be artifactual. Bloodwork normal. He was discharged home on Levetiracetam 750mg  BID.He reports missing 2 doses the day before Christmas then having another nocturnal seizure on 03/22/22. He again bit his tongue. He has some drowsiness with the Levetiracetam. He has occasional body twitches in his head, neck, and back that he attributes to bulging discs. His legs would just jump uncontrollable sometimes, twitching and trembling. After a long day, his head may twitch uncontrollably. No associated confusion. This started around are 22 or 23. He has occasional episodes of deja vu. No staring/unresponsive episodes, focal numbness/tingling/weakness, olfactory/gustatory hallucinations. He used to have migraines but denies any more headaches. No dizziness, diplopia, dysarthria/dysphagia, bowel/bladder dysfunction. He usually gets 7-9 hours of sleep. He drinks 2-3 liquor beverages daily. He smokes marijuana. He denies any alcohol prior to the first seizure but recalls having a drink the night before the second and third seizures. He lives with his mother. Mood is fine.   Epilepsy Risk Factors:  His mother had one seizure and takes Topiramate. HIs father had 2 alcohol-related seizures. He had a normal birth and early development.  There is no history of febrile convulsions, CNS infections such as meningitis/encephalitis, significant traumatic brain injury, neurosurgical procedures, or family history of seizures.  Prior ASMs: Levetiracetam (mood changes), Zonisamide (waves of emotions he breaks down and cries everyday)     Current Outpatient Medications on File Prior to Visit  Medication Sig Dispense Refill   clindamycin (CLINDAGEL) 1 % gel Apply topically 2 (two) times daily. 30 g 4   doxycycline (VIBRA-TABS) 100 MG tablet Take 1 tablet (100 mg total) by mouth 2 (two) times daily. (Patient not taking: Reported on 11/30/2022) 14 tablet 0   topiramate (TOPAMAX) 100 MG tablet Take 1  tablet (100 mg total) by mouth 2 (two) times daily. 60 tablet 6   No current facility-administered medications on file prior to visit.     Observations/Objective:   GEN:  The patient appears stated age and is in NAD.  Neurological examination: Patient is awake, alert. No aphasia or dysarthria. Intact fluency and comprehension. Cranial nerves: Extraocular movements intact with no nystagmus. No facial asymmetry. Motor: moves all extremities symmetrically, at least anti-gravity x 4. Gait: narrow-based and steady, no ataxia.    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 Brain MRI and EEG normal. His mother reports head turn to the left with 2 seizures witnessed, most recently 12/01/22. He increased Topiramate to 100mg  BID after that. We discussed having prn Valtoco for seizure rescue, side effects discussed. He is aware of [place] driving laws to stop driving until 6 months seizure-free. Follow-up in 4 months, call for any changes.    Follow Up Instructions:   -I discussed the assessment and treatment plan with the patient. The patient was provided an opportunity to ask questions and all were answered. The patient agreed with the plan and demonstrated an  understanding of the instructions.   The patient was advised to call back or seek an in-person evaluation if the symptoms worsen or if the condition fails to improve as anticipated.     Van Clines, MD

## 2022-12-03 NOTE — Patient Instructions (Signed)
I hope you feel better soon.  Continue Topiramate 100mg  twice a day  2. A prescription for Valtoco nasal spray was sent for seizure rescue  3. Follow-up in 4 months, call for any changes   Seizure Precautions: 1. If medication has been prescribed for you to prevent seizures, take it exactly as directed.  Do not stop taking the medicine without talking to your doctor first, even if you have not had a seizure in a long time.   2. Avoid activities in which a seizure would cause danger to yourself or to others.  Don't operate dangerous machinery, swim alone, or climb in high or dangerous places, such as on ladders, roofs, or girders.  Do not drive unless your doctor says you may.  3. If you have any warning that you may have a seizure, lay down in a safe place where you can't hurt yourself.    4.  No driving for 6 months from last seizure, as per [facility].   Please refer to the following link on the Epilepsy Foundation of America's website for more information: http://www.epilepsyfoundation.org/answerplace/Social/driving/drivingu.cfm   5.  Maintain good sleep hygiene. Avoid alcohol.  6.  Contact your doctor if you have any problems that may be related to the medicine you are taking.  7.  Call 911 and bring the patient back to the ED if:        A.  The seizure lasts longer than 5 minutes.       B.  The patient doesn't awaken shortly after the seizure  C.  The patient has new problems such as difficulty seeing, speaking or moving  D.  The patient was injured during the seizure  E.  The patient has a temperature over 102 F (39C)  F.  The patient vomited and now is having trouble breathing       '

## 2022-12-15 ENCOUNTER — Ambulatory Visit (HOSPITAL_COMMUNITY)
Admission: EM | Admit: 2022-12-15 | Discharge: 2022-12-15 | Disposition: A | Discharge status: Home / Self Care | Payer: 59

## 2022-12-15 ENCOUNTER — Ambulatory Visit (INDEPENDENT_AMBULATORY_CARE_PROVIDER_SITE_OTHER): Payer: 59 | Admitting: Physician Assistant

## 2022-12-15 VITALS — BP 100/60 | HR 74 | Temp 97.7°F | Ht 70.0 in | Wt 131.2 lb

## 2022-12-15 DIAGNOSIS — R45851 Suicidal ideations: Secondary | ICD-10-CM

## 2022-12-15 DIAGNOSIS — F32A Depression, unspecified: Secondary | ICD-10-CM | POA: Diagnosis not present

## 2022-12-15 DIAGNOSIS — F331 Major depressive disorder, recurrent, moderate: Secondary | ICD-10-CM

## 2022-12-15 DIAGNOSIS — R569 Unspecified convulsions: Secondary | ICD-10-CM

## 2022-12-15 DIAGNOSIS — G40909 Epilepsy, unspecified, not intractable, without status epilepticus: Secondary | ICD-10-CM

## 2022-12-15 NOTE — ED Notes (Signed)
Patient d/c in stable condition. AVS reviewed with patient stating understanding of instructions rendered. Patient denies SI, HI, AVS.

## 2022-12-15 NOTE — Progress Notes (Signed)
   12/15/22 1259  [facility] Triage Screening (Walk-ins at [facility] only)  How Did You Hear About [place]? Family/Friend  What Is the Reason for Your Visit/Call Today? John King is a 27 yr old male presenting to [facility] accompanied by his mother. Pt reports that he had a seziure last year, fell and hit his head. Pt does report to be on medication for his seziures at this time, but says he still has seziures that have been worsening. A few weeks ago, pt had another seziure that has caused him significant stress. Pt states, "I cannot think clearly and I am very easily distracted". Pt is looking for a medication to help with his depression, that will balance well with his seziure medications. Pt does not have a therapist and is looking for one currently. Pt mentions that he has passive suicidal thoughts but does not currently have any thoughts now. Pt does report to smoke marijuana occasionally. He reported to smoke "a few puffs" this morning. Pt denies alcohol use, SI, HI and AVH.  How Long Has This Been Causing You Problems? 1 wk - 1 month  Have You Recently Had Any Thoughts About Hurting Yourself? No  Are You Planning to Commit Suicide/Harm Yourself At This time? No  Have you Recently Had Thoughts About Hurting Someone Karolee Ohs? No  Are You Planning To Harm Someone At This Time? No  Are you currently experiencing any auditory, visual or other hallucinations? No  Have You Used Any Alcohol or Drugs in the Past 24 Hours? Yes  How long ago did you use Drugs or Alcohol? this morning  What Did You Use and How Much? few puffs  Do you have any current medical co-morbidities that require immediate attention? No  Clinician description of patient physical appearance/behavior: cooperative, calm  What Do You Feel Would Help You the Most Today? Medication(s)  If access to [facility] Ur[facility] was not available, would you have sought care in the Emergency Department? No  Determination of Need Routine (7 days)  Options For Referral Outpatient  Therapy

## 2022-12-15 NOTE — Patient Instructions (Addendum)
Please go immediately to this address for immediate help:  [facility] at [facility] [address] [place], [place] [address] [phone].

## 2022-12-15 NOTE — ED Provider Notes (Signed)
Behavioral Health Urgent Care Medical Screening Exam  Patient Name: John King MRN: [id number] Date of Evaluation: 12/15/22 Chief Complaint:  I want to start meds for depression Diagnosis:  Final diagnoses:  MDD (major depressive disorder), recurrent episode, moderate (HCC)  Seizures (HCC)    History of Present illness: John King is a 27 y.o. male with a history of seizures presenting voluntarily to [facility] with worsening depression symptoms since the onset of seizures one year ago. Patient was seen at his PCP office by Dr. Karel King office earlier today and expressed suicidal ideation without a plan. Patient is currently taking Topamax 100 md bid for seizures.  Patient reports he recently had a seizure at the beginning of September and his neurologist John King- MD increased his Topamax 100 mg twice daily.   Nurse practitioner assessed patient face-to-face and reviewed his chart.  John King is alert oriented x 4, speech is clear and coherent, thought processes is linear and goal-directed, mood is depressed with congruent affect.  Patient has some passive suicidal ideations with no specific plan or intent to do harm to himself.  Patient denies any previous inpatient psychiatric treatment or outpatient psychiatric treatment or therapy.  Patient reports that he is single and lives at home with his mother and has a young daughter. 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  Patient states that he experienced a seizure about a year ago, patient is unsure if he hit his head and that caused his seizures or seizures caused him to his head.  Patient reports he was initially started keppra for his seizures but he was sad and had crying spells and was switched to topomax. Patient reports that he worries a lot about having the seizures, because he does not know when they will happen. Patient reports that he feels frustrated because he does not know what is causing  the seizures. Patient reports that he smokes marijuana about 2-3 joints each day and recently stopped drinking alcohol. Patient reports that his mother has seizure disorder and takes topamax but has not had a seizure in several years.   Patient reports that he has had some suicidal ideations but reports that he does not have a plan and that he would not go through with it because of his mother and his daughter. Patient is able to contract for safety and will be discharged to follow up at [facility] outpatient services during walk in hours to get established with outpatient psychiatric services.  Flowsheet Row ED from 12/15/2022 in [facility] ED from 08/01/2022 in [facility] Emergency Department at [facility] ED from 02/04/2022 in [facility] Emergency Department at [facility]  C-SSRS RISK CATEGORY No Risk No Risk No Risk       Psychiatric Specialty Exam  Presentation  General Appearance:Casual  Eye Contact:Good  Speech:Clear and Coherent  Speech Volume:Normal  Handedness:Right   Mood and Affect  Mood: Depressed  Affect: Appropriate   Thought Process  Thought Processes: Coherent  Descriptions of Associations:Intact  Orientation:Full (Time, Place and Person)  Thought Content:WDL    Hallucinations:None  Ideas of Reference:None  Suicidal Thoughts:Yes, Passive Without Intent; Without Plan  Homicidal Thoughts:No   Sensorium  Memory: Immediate Fair; Recent Fair; Remote Fair  Judgment: Fair  Insight: Fair   Chartered certified accountant: Fair  Attention Span: Fair  Recall: [employer] of Knowledge: Fair  Language: Good   Psychomotor Activity  Psychomotor  Activity: Normal   Assets  Assets: Communication Skills; Desire for Improvement; Housing; Resilience; Social Support; Transportation   Sleep  Sleep: Fair  Number of hours:  6   Physical Exam: Physical Exam HENT:     Head:  Normocephalic and atraumatic.     Nose: Nose normal.  Eyes:     Pupils: Pupils are equal, round, and reactive to light.  Cardiovascular:     Rate and Rhythm: Normal rate.  Pulmonary:     Effort: Pulmonary effort is normal.  Abdominal:     General: Abdomen is flat.  Musculoskeletal:        General: Normal range of motion.     Cervical back: Normal range of motion.  Skin:    General: Skin is warm.  Neurological:     Mental Status: He is alert and oriented to person, place, and time.  Psychiatric:        Attention and Perception: Attention normal.        Mood and Affect: Mood is depressed.        Speech: Speech normal.        Behavior: Behavior is cooperative.        Thought Content: Thought content normal.        Cognition and Memory: Cognition normal.        Judgment: Judgment normal.   Review of Systems  Constitutional: Negative.   HENT: Negative.    Eyes: Negative.   Respiratory: Negative.    Cardiovascular: Negative.   Gastrointestinal: Negative.   Genitourinary: Negative.   Musculoskeletal: Negative.   Skin: Negative.   Neurological:  Positive for seizures.  Endo/Heme/Allergies: Negative.   Psychiatric/Behavioral:  Positive for depression.    Blood pressure 91/63, pulse 68, temperature 98.6 F (37 C), temperature source Oral, resp. rate 19, SpO2 100%. There is no height or weight on file to calculate BMI.  Musculoskeletal: Strength & Muscle Tone: within normal limits Gait & Station: normal Patient leans: N/A   [facility] MSE Discharge Disposition for Follow up and Recommendations: Based on my evaluation the patient does not appear to have an emergency medical condition and can be discharged with resources and follow up care in outpatient services for Medication Management and Individual Therapy Patient will follow up with [facility] outpatient services in the walk in clinic to establish medication management services and therapy.   Jasper Riling, NP 12/15/2022, 2:29  PM

## 2022-12-15 NOTE — Discharge Instructions (Addendum)

## 2022-12-15 NOTE — Progress Notes (Signed)
Subjective:    Patient ID: John King, male    DOB: 21-Nov-1995, 27 y.o.   MRN: [id number]  Chief Complaint  Patient presents with   Seizures    Discuss concerns following seizure    Seizures    Patient is in today for concerns -   Recent seizure 12/01/22. "I haven't been the same since then."  Feeling a tingling sensation everywhere - fingers, toes, face, shoulders. Heart is racing at times.  He's not sure if it's medicine related or if coming from his most recent seizure. Reports having feeling of suicidal ideations since medicine increase, more extreme than ever. Can't concentrate well. 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  Sometimes wants to just walk with nowhere to go, sometimes wants to drive his head through a wall. Most recent SI was yesterday. Very tearful in office today.  No recent seizure activity.   His Aunt drove him to our office today.    Past Medical History:  Diagnosis Date   Seizures (HCC) 02-05-22    Past Surgical History:  Procedure Laterality Date   MOUTH SURGERY      Family History  Problem Relation Age of Onset   Seizures Mother        one only in HS   Cancer Mother 52       breast   Hypertension Mother    Hypertension Father    Alcohol abuse Father    Seizures Father        etoh withdrawal   CAD Other        50-60's. maternal side    Social History   Tobacco Use   Smoking status: Every Day    Types: Cigars   Smokeless tobacco: Never  Vaping Use   Vaping status: Every Day  Substance Use Topics   Alcohol use: Yes    Alcohol/week: 7.0 standard drinks of alcohol    Types: 7 Shots of liquor per week    Comment: 2-3 on weekends   Drug use: Yes    Types: Marijuana    Comment: Daily     No Known Allergies  Review of Systems  Neurological:  Positive for seizures.   NEGATIVE UNLESS OTHERWISE INDICATED IN HPI      Objective:     BP 100/60   Pulse 74   Temp 97.7 F (36.5 C)   Ht 5'  10" (1.778 m)   Wt 131 lb 3.2 oz (59.5 kg)   SpO2 99%   BMI 18.83 kg/m   Wt Readings from Last 3 Encounters:  12/15/22 131 lb 3.2 oz (59.5 kg)  12/03/22 130 lb (59 kg)  11/30/22 130 lb 6 oz (59.1 kg)    BP Readings from Last 3 Encounters:  12/15/22 100/60  11/30/22 110/61  11/11/22 128/70     Physical Exam Vitals and nursing note reviewed.  Constitutional:      General: He is in acute distress.  Cardiovascular:     Rate and Rhythm: Normal rate and regular rhythm.     Pulses: Normal pulses.     Heart sounds: No murmur heard. Pulmonary:     Effort: Pulmonary effort is normal.     Breath sounds: Normal breath sounds.  Skin:    General: Skin is warm.  Neurological:     General: No focal deficit present.     Mental Status: He is alert and oriented to person, place, and time.  Motor: No weakness.     Gait: Gait normal.  Psychiatric:        Mood and Affect: Mood is depressed. Affect is tearful.        Thought Content: Thought content includes suicidal ideation. Thought content does not include suicidal plan.        Assessment & Plan:  Depression with suicidal ideation  Seizure disorder (HCC)   I suspect patient has had underlying depression untreated for some time now. Possibly the increase in topamax has amplified these symptoms, now having SI without plan. Discussed with patient he needs psych and neuro team to come together to help his situation the best. Need to control seizure disorder, but also need to manage his depression. Very high phq9 and gad scores today. Sent directly to [facility] for immediate help. His aunt will take him there. Pt agreeable and understanding of plan.     12/15/2022   11:38 AM 12/15/2022   11:35 AM 11/11/2022    8:27 AM  PHQ9 SCORE ONLY  PHQ-9 Total Score 24 0 12      12/15/2022   11:38 AM 12/15/2022   11:36 AM 11/01/2022    1:33 PM 05/03/2022    1:31 PM  GAD 7 : Generalized Anxiety Score  Nervous, Anxious, on Edge 3 0 3 1   Control/stop worrying 3 0 3 1  Worry too much - different things 3 0 3 1  Trouble relaxing 2 0 2 0  Restless 2 0 1 0  Easily annoyed or irritable 3 0 2 2  Afraid - awful might happen 3 0 2 2  Total GAD 7 Score 19 0 16 7  Anxiety Difficulty Extremely difficult Not difficult at all Very difficult Somewhat difficult     Time Spent: 31 minutes of total time was spent on the date of the encounter performing the following actions: chart review prior to seeing the patient, obtaining history, performing a medically necessary exam, counseling on the treatment plan, placing orders, and documenting in our EHR.       Bird Tailor M Charliee Krenz, PA-C

## 2022-12-16 ENCOUNTER — Ambulatory Visit (INDEPENDENT_AMBULATORY_CARE_PROVIDER_SITE_OTHER): Payer: 59 | Admitting: Physician Assistant

## 2022-12-16 VITALS — BP 118/70 | HR 54 | Ht 69.0 in | Wt 128.6 lb

## 2022-12-16 DIAGNOSIS — F331 Major depressive disorder, recurrent, moderate: Secondary | ICD-10-CM | POA: Diagnosis not present

## 2022-12-16 DIAGNOSIS — F411 Generalized anxiety disorder: Secondary | ICD-10-CM | POA: Diagnosis not present

## 2022-12-16 NOTE — Progress Notes (Unsigned)
Psychiatric Initial Adult Assessment   Patient Identification: John King MRN:  [id number] Date of Evaluation:  12/17/2022 Referral Source: Referred by his primary care provider Chief Complaint:   Chief Complaint  Patient presents with   Establish Care   Medication Management   Visit Diagnosis:    ICD-10-CM   1. Moderate episode of recurrent major depressive disorder (HCC)  F33.1     2. Generalized anxiety disorder  F41.1       History of Present Illness:  ***  Susana C. Ashley Royalty  Associated Signs/Symptoms: Depression Symptoms:  depressed mood, anhedonia, insomnia, hypersomnia, psychomotor agitation, psychomotor retardation, fatigue, feelings of worthlessness/guilt, difficulty concentrating, hopelessness, impaired memory, recurrent thoughts of death, suicidal thoughts without plan, suicidal thoughts with specific plan, anxiety, panic attacks, loss of energy/fatigue, disturbed sleep, weight loss, decreased labido, (Hypo) Manic Symptoms:  Delusions, Distractibility, Elevated Mood, Flight of Ideas, Licensed conveyancer, Impulsivity, Irritable Mood, Labiality of Mood, Sexually Inapproprite Behavior, Anxiety Symptoms:  Agoraphobia, Excessive Worry, Panic Symptoms, Obsessive Compulsive Symptoms:   Always trying to straighten everything out, Social Anxiety, Specific Phobias, Psychotic Symptoms:  Paranoia, PTSD Symptoms: Had a traumatic exposure:  Patient reports that he lost his friend to suicide. Patient states that he has an uncle in jail for rape and murder. Had a traumatic exposure in the last month:  N/A Re-experiencing:  Patient denies Hypervigilance:  No Hyperarousal:  Difficulty Concentrating Emotional Numbness/Detachment Sleep Avoidance:  Foreshortened Future  Past Psychiatric History:  Patient denies a past history of psychiatric illness  Patient denies a past history of hospitalization due to mental health  Patient denies a past  history of suicide attempt  Patient denies a past history of homicide attempts  Previous Psychotropic Medications: No   Substance Abuse History in the last 12 months:  Yes.    Consequences of Substance Abuse: Patient reports that he uses marijuana Medical Consequences:  Patient denies Legal Consequences:  Patient denies Family Consequences:  Patient denies Blackouts:  Patient denies DT's: Patient denies Withdrawal Symptoms:   None  Past Medical History:  Past Medical History:  Diagnosis Date   Seizures (HCC) 02-05-22    Past Surgical History:  Procedure Laterality Date   MOUTH SURGERY      Family Psychiatric History:  Patient denies a family history of psychiatric illness  Family history of suicide attempt: Patient denies, but states that he lost a friend to suicide Family history of homicide attempt: Patient denies Family history of substance abuse: Patient reports that his father and uncle are alcoholics  Family History:  Family History  Problem Relation Age of Onset   Seizures Mother        one only in HS   Cancer Mother 71       breast   Hypertension Mother    Hypertension Father    Alcohol abuse Father    Seizures Father        etoh withdrawal   CAD Other        50-60's. maternal side    Social History:   Social History   Socioeconomic History   Marital status: Single    Spouse name: Not on file   Number of children: 0   Years of education: Not on file   Highest education level: Not on file  Occupational History   Not on file  Tobacco Use   Smoking status: Every Day    Types: Cigars   Smokeless tobacco: Never  Vaping Use   Vaping status: Every Day  Substance and Sexual Activity   Alcohol use: Yes    Alcohol/week: 7.0 standard drinks of alcohol    Types: 7 Shots of liquor per week    Comment: 2-3 on weekends   Drug use: Yes    Types: Marijuana    Comment: Daily   Sexual activity: Yes    Birth control/protection: None  Other Topics  Concern   Not on file  Social History Narrative   Unem.  1 step dau.   Occ hosts parties on weekends.   Are you right handed or left handed? Right    Are you currently employed ? No    What is your current occupation?   Do you live at home alone? no   Who lives with you? With family    What type of home do you live in: 1 story or 2 story?  15 steps        Social Determinants of Health   Financial Resource Strain: Not on file  Food Insecurity: Not on file  Transportation Needs: Not on file  Physical Activity: Not on file  Stress: Not on file  Social Connections: Not on file    Additional Social History:  Patient endorses social support.  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  Patient endorses housing.  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  Patient denies past history of military experience.  Patient denies a past history of prison or jail time.  Highest education and by the patient is his high school diploma.  Patient endorses access to weapons but states that they are in a secure location.  Allergies:  No Known Allergies  Metabolic Disorder Labs: No results found for: "HGBA1C", "MPG" No results found for: "PROLACTIN" No results found for: "CHOL", "TRIG", "HDL", "CHOLHDL", "VLDL", "LDLCALC" Lab Results  Component Value Date   TSH 0.61 08/04/2022    Therapeutic Level Labs: No results found for: "LITHIUM" No results found for: "CBMZ" No results found for: "VALPROATE"  Current Medications: Current Outpatient Medications  Medication Sig Dispense Refill   clindamycin (CLINDAGEL) 1 % gel Apply topically 2 (two) times daily. 30 g 4   diazePAM, 15 MG Dose, (VALTOCO 15 MG DOSE) 2 x 7.5 MG/0.1ML LQPK Administer one spray in one nostril, other spray in other nostril (one dose) as needed for seizure. May use another dose 4 hours after if needed. 5 each 5   topiramate (TOPAMAX) 100 MG tablet Take 1 tablet (100 mg total) by mouth 2 (two) times daily. 60  tablet 6   No current facility-administered medications for this visit.    Musculoskeletal: Strength & Muscle Tone: within normal limits Gait & Station: normal Patient leans: N/A  Psychiatric Specialty Exam: Review of Systems  Psychiatric/Behavioral:  Positive for decreased concentration, dysphoric mood, hallucinations and sleep disturbance. Negative for self-injury and suicidal ideas. The patient is nervous/anxious. The patient is not hyperactive.     Blood pressure 118/70, pulse (!) 54, height 5\' 9"  (1.753 m), weight 128 lb 9.6 oz (58.3 kg), SpO2 100%.Body mass index is 18.99 kg/m.  General Appearance: Casual  Eye Contact:  Good  Speech:  Clear and Coherent and Normal Rate  Volume:  Normal  Mood:  Anxious, Depressed, and Dysphoric  Affect:  Congruent  Thought Process:  Coherent, Goal Directed, and Descriptions of Associations: Intact  Orientation:  Full (Time, Place, and Person)  Thought Content:  WDL  Suicidal Thoughts:  No  Homicidal Thoughts:  No  Memory:  Immediate;   Good Recent;   Good Remote;   Good  Judgement:  Good  Insight:  Present  Psychomotor Activity:  Normal  Concentration:  Concentration: Good and Attention Span: Good  Recall:  Good  Fund of Knowledge:Good  Language: Good  Akathisia:  No  Handed:  Right  AIMS (if indicated):  not done  Assets:  Communication Skills Desire for Improvement Housing Social Support Transportation Vocational/Educational  ADL's:  Intact  Cognition: WNL  Sleep:  Poor   Screenings: GAD-7    Flowsheet Row Office Visit from 12/16/2022 in [facility] Office Visit from 12/15/2022 in [facility] Visit from 11/01/2022 in [facility] from 05/03/2022 in [facility]  Total GAD-7 Score 19 19 16 7       [employer]    Flowsheet Row Office Visit from 12/16/2022 in [facility] Office Visit  from 12/15/2022 in [facility] Visit from 11/11/2022 in [facility] from 11/01/2022 in [facility] from 05/03/2022 in [facility]  PHQ-2 Total Score 5 5 4 5 2   PHQ-9 Total Score 21 24 12 14 10       Flowsheet Row Office Visit from 12/16/2022 in [facility] ED from 12/15/2022 in [facility] ED from 08/01/2022 in [facility] Emergency Department at [facility]  C-SSRS RISK CATEGORY High Risk No Risk No Risk       Assessment and Plan: ***    Collaboration of Care: Primary Care Provider AEB patient is being seen by a family medicine provider, Psychiatrist AEB patient is being followed by mental health provider at this facility, Other provider involved in patient's care AEB patient being seen by neurology, and Referral or follow-up with counselor/therapist AEB patient being seen by licensed clinical social worker at this facility  Patient/Guardian was advised Release of Information must be obtained prior to any record release in order to collaborate their care with an outside provider. Patient/Guardian was advised if they have not already done so to contact the registration department to sign all necessary forms in order for [place] to release information regarding their care.   Consent: Patient/Guardian gives verbal consent for treatment and assignment of benefits for services provided during this visit. Patient/Guardian expressed understanding and agreed to proceed.   1. Moderate episode of recurrent major depressive disorder (HCC)  2. Generalized anxiety disorder  Patient to follow up in 6 weeks Provider spent a total of 47 minutes with the patient/reviewing patient's chart  Meta Hatchet, PA 9/20/20247:46 PM

## 2022-12-17 DIAGNOSIS — F411 Generalized anxiety disorder: Secondary | ICD-10-CM | POA: Insufficient documentation

## 2022-12-17 DIAGNOSIS — F331 Major depressive disorder, recurrent, moderate: Secondary | ICD-10-CM | POA: Insufficient documentation

## 2022-12-19 ENCOUNTER — Encounter (HOSPITAL_COMMUNITY): Payer: Self-pay | Admitting: Physician Assistant

## 2022-12-23 ENCOUNTER — Other Ambulatory Visit (HOSPITAL_COMMUNITY): Payer: Self-pay | Admitting: Physician Assistant

## 2022-12-23 DIAGNOSIS — F411 Generalized anxiety disorder: Secondary | ICD-10-CM

## 2022-12-23 DIAGNOSIS — F331 Major depressive disorder, recurrent, moderate: Secondary | ICD-10-CM

## 2022-12-23 NOTE — Progress Notes (Signed)
Provider reach out to patient to discuss medication options for the management of his depression and anxiety.  Provider informed patient that his neurologist to discuss antidepressant options for the patient to be on.  Patient's neurologist approved for patient to be placed on any of the SSRI antidepressants.  Patient told provider that he wanted to continue to do some research on the different types of medications within SSRIs before being placed on any other medications.  Patient stated that he would reach out to provider once he has made his decision on what SSRI medication to be placed on.

## 2023-01-02 ENCOUNTER — Other Ambulatory Visit: Payer: Self-pay | Admitting: Neurology

## 2023-01-04 ENCOUNTER — Encounter (HOSPITAL_COMMUNITY): Payer: PRIVATE HEALTH INSURANCE | Admitting: Physician Assistant

## 2023-01-18 ENCOUNTER — Ambulatory Visit (INDEPENDENT_AMBULATORY_CARE_PROVIDER_SITE_OTHER): Payer: 59 | Admitting: Physician Assistant

## 2023-01-18 VITALS — BP 107/64 | HR 80 | Ht 69.0 in | Wt 131.2 lb

## 2023-01-18 DIAGNOSIS — F411 Generalized anxiety disorder: Secondary | ICD-10-CM | POA: Diagnosis not present

## 2023-01-18 DIAGNOSIS — F331 Major depressive disorder, recurrent, moderate: Secondary | ICD-10-CM | POA: Diagnosis not present

## 2023-01-18 MED ORDER — SERTRALINE HCL 25 MG PO TABS
25.0000 mg | ORAL_TABLET | Freq: Every day | ORAL | 1 refills | Status: DC
Start: 2023-01-18 — End: 2023-05-30

## 2023-01-18 NOTE — Progress Notes (Signed)
BH MD/PA/NP OP Progress Note  01/19/2023 12:07 PM John King  MRN:  [id number]  Chief Complaint:  Chief Complaint  Patient presents with   Follow-up   Medication Management   HPI:   John King is a 27 year old male with a past psychiatric history significant for major depressive disorder (moderate episode, recurrent) and generalized anxiety disorder presents to [facility] for follow-up and medication management.  Patient is not currently on any psychiatric medications at this time.  Patient presents to the encounter expressing concerns over the antidepressants he did research on.  He states that he has concerns over Zoloft and Lexapro in particular.  During his research, he reports that he uncovered that Zoloft and Lexapro could interfere with his Topamax prescription making him more prone to seizures.  Instead of starting psychiatric medications, patient would like to be set up with therapy.  Despite his initial concerns with Zoloft, patient does show some interest in possibly using Zoloft in the future.  He reports that before starting Zoloft, he would like to get his seizures under control.  In regards to his depression, he reports that his symptoms have been "touch and go."  He reports having moments of depression especially when sitting in his house and not doing anything.  He reports that his most recent seizure killed the person he was and he feels like a child again.  He does report that the more he goes out and sees people, the more he feels better.  He reports that he recently started working back at the [employer] where he was working before.  He states that once he goes back home from work, he feels depressed again.  In addition to depression, patient endorses anxiety and rates his anxiety as 7 out of 10.  Patient's main stressor revolves around his ex getting evicted and now he has to remove his things from that place of  residence.  A PHQ-9 screen was performed with the patient scoring a 15.  A GAD-7 screen was also performed with the patient scoring a 12.  Patient is alert and oriented x 4, calm, cooperative, and fully engaged in conversation during the encounter.  Patient endorses all right mood but does express being annoyed.  Patient denies suicidal or homicidal ideations.  He further denies auditory or visual hallucinations and does not appear to be responding to internal/external stimuli.  Patient endorses fluctuating sleep and receives on average 6 to 10 hours of sleep per night.  Patient endorses fluctuating appetite stating that some days he does not eat while on other days, he overeats.  Patient endorses alcohol consumption sparingly.  Patient endorses tobacco use.  Patient endorses illicit drug use in the form of marijuana.  Visit Diagnosis:    ICD-10-CM   1. Moderate episode of recurrent major depressive disorder (HCC)  F33.1 sertraline (ZOLOFT) 25 MG tablet    2. Generalized anxiety disorder  F41.1 sertraline (ZOLOFT) 25 MG tablet      Past Psychiatric History:  Patient has a past psychiatric history significant for major depressive disorder and generalized anxiety disorder  Patient denies a past history of hospitalization due to mental health   Patient denies a past history of suicide attempt   Patient denies a past history of homicide attempts  Past Medical History:  Past Medical History:  Diagnosis Date   Seizures (HCC) 02-05-22    Past Surgical History:  Procedure Laterality Date   MOUTH SURGERY  Family Psychiatric History:  Patient denies a family history of psychiatric illness   Family history of suicide attempt: Patient denies, but states that he lost a friend to suicide Family history of homicide attempt: Patient denies Family history of substance abuse: Patient reports that his father and uncle are alcoholics  Family History:  Family History  Problem Relation Age of  Onset   Seizures Mother        one only in HS   Cancer Mother 12       breast   Hypertension Mother    Hypertension Father    Alcohol abuse Father    Seizures Father        etoh withdrawal   CAD Other        50-60's. maternal side    Social History:  Social History   Socioeconomic History   Marital status: Single    Spouse name: Not on file   Number of children: 0   Years of education: Not on file   Highest education level: Not on file  Occupational History   Not on file  Tobacco Use   Smoking status: Every Day    Types: Cigars   Smokeless tobacco: Never  Vaping Use   Vaping status: Every Day  Substance and Sexual Activity   Alcohol use: Yes    Alcohol/week: 7.0 standard drinks of alcohol    Types: 7 Shots of liquor per week    Comment: 2-3 on weekends   Drug use: Yes    Types: Marijuana    Comment: Daily   Sexual activity: Yes    Birth control/protection: None  Other Topics Concern   Not on file  Social History Narrative   Unem.  1 step dau.   Occ hosts parties on weekends.   Are you right handed or left handed? Right    Are you currently employed ? No    What is your current occupation?   Do you live at home alone? no   Who lives with you? With family    What type of home do you live in: 1 story or 2 story?  15 steps        Social Determinants of Health   Financial Resource Strain: Not on file  Food Insecurity: Not on file  Transportation Needs: Not on file  Physical Activity: Not on file  Stress: Not on file  Social Connections: Not on file    Allergies: No Known Allergies  Metabolic Disorder Labs: No results found for: "HGBA1C", "MPG" No results found for: "PROLACTIN" No results found for: "CHOL", "TRIG", "HDL", "CHOLHDL", "VLDL", "LDLCALC" Lab Results  Component Value Date   TSH 0.61 08/04/2022   TSH 0.33 (L) 03/02/2022    Therapeutic Level Labs: No results found for: "LITHIUM" No results found for: "VALPROATE" No results found for:  "CBMZ"  Current Medications: Current Outpatient Medications  Medication Sig Dispense Refill   sertraline (ZOLOFT) 25 MG tablet Take 1 tablet (25 mg total) by mouth daily. 30 tablet 1   clindamycin (CLINDAGEL) 1 % gel Apply topically 2 (two) times daily. 30 g 4   diazePAM, 15 MG Dose, (VALTOCO 15 MG DOSE) 2 x 7.5 MG/0.1ML LQPK Administer one spray in one nostril, other spray in other nostril (one dose) as needed for seizure. May use another dose 4 hours after if needed. 5 each 5   topiramate (TOPAMAX) 100 MG tablet Take 1 tablet (100 mg total) by mouth 2 (two) times daily. 60 tablet 6  No current facility-administered medications for this visit.     Musculoskeletal: Strength & Muscle Tone: within normal limits Gait & Station: normal Patient leans: N/A  Psychiatric Specialty Exam: Review of Systems  Psychiatric/Behavioral:  Positive for dysphoric mood and sleep disturbance. Negative for decreased concentration, hallucinations, self-injury and suicidal ideas. The patient is nervous/anxious. The patient is not hyperactive.     Blood pressure 107/64, pulse 80, height 5\' 9"  (1.753 m), weight 131 lb 3.2 oz (59.5 kg), SpO2 100%.Body mass index is 19.37 kg/m.  General Appearance: Casual  Eye Contact:  Good  Speech:  Clear and Coherent and Normal Rate  Volume:  Normal  Mood:  Anxious and Depressed  Affect:  Congruent  Thought Process:  Coherent, Goal Directed, and Descriptions of Associations: Intact  Orientation:  Full (Time, Place, and Person)  Thought Content: WDL   Suicidal Thoughts:  No  Homicidal Thoughts:  No  Memory:  Immediate;   Good Recent;   Good Remote;   Good  Judgement:  Fair  Insight:  Present  Psychomotor Activity:  Normal  Concentration:  Concentration: Good and Attention Span: Good  Recall:  Good  Fund of Knowledge: Good  Language: Good  Akathisia:  No  Handed:  Right  AIMS (if indicated): not done  Assets:  Communication Skills Desire for  Improvement Housing Social Support Transportation Vocational/Educational  ADL's:  Intact  Cognition: WNL  Sleep:  Fair   Screenings: GAD-7    Flowsheet Row Clinical Support from 01/18/2023 in [facility] Office Visit from 12/16/2022 in [facility] Office Visit from 12/15/2022 in [facility] Office Visit from 11/01/2022 in [facility] Visit from 05/03/2022 in [facility]  Total GAD-7 Score 12 19 19 16 7       PHQ2-9    Flowsheet Row Clinical Support from 01/18/2023 in [facility] Office Visit from 12/16/2022 in [facility] Visit from 12/15/2022 in [facility] Office Visit from 11/11/2022 in [facility] Visit from 11/01/2022 in [facility]  PHQ-2 Total Score 3 5 5 4 5   PHQ-9 Total Score 15 21 24 12 14       Flowsheet Row Clinical Support from 01/18/2023 in [facility] Office Visit from 12/16/2022 in [facility] ED from 12/15/2022 in [facility]  C-SSRS RISK CATEGORY High Risk High Risk No Risk        Assessment and Plan:   John King is a 27 year old male with a past psychiatric history significant for major depressive disorder (moderate episode, recurrent) and generalized anxiety disorder presents to [facility] for follow-up and medication management.  Patient presents to encounter stating that he would like to do therapy instead of medication management after his research revealed that his use of Zoloft or Lexapro may interfere with his use of Topamax making him more susceptible to having a seizure.  Patient reports that he is open to the idea of taking Zoloft for the management of his  depressive symptoms and anxiety, but states that he would like to try out therapy first.   Patient continues to endorse ongoing depressive symptoms and anxiety.  He reports that he feels like a child ever since his last seizure.  Patient appears to attribute his current symptoms of depression and anxiety to his most recent seizure.  Patient requested he be provided a prescription for Zoloft 25 mg daily for the management of his anxiety and depression just in case he changes his mind.  Collaboration of Care: Collaboration of Care: Medication Management AEB provider managing patient's psychiatric medications, Psychiatrist AEB patient being seen by a mental health provider at this facility, Other provider involved in patient's care AEB being seen by neurology, and Referral or follow-up with counselor/therapist AEB patient being seen by licensed clinical social worker at this facility  Patient/Guardian was advised Release of Information must be obtained prior to any record release in order to collaborate their care with an outside provider. Patient/Guardian was advised if they have not already done so to contact the registration department to sign all necessary forms in order for [place] to release information regarding their care.   Consent: Patient/Guardian gives verbal consent for treatment and assignment of benefits for services provided during this visit. Patient/Guardian expressed understanding and agreed to proceed.   1. Moderate episode of recurrent major depressive disorder (HCC)  - sertraline (ZOLOFT) 25 MG tablet; Take 1 tablet (25 mg total) by mouth daily.  Dispense: 30 tablet; Refill: 1  2. Generalized anxiety disorder  - sertraline (ZOLOFT) 25 MG tablet; Take 1 tablet (25 mg total) by mouth daily.  Dispense: 30 tablet; Refill: 1  Patient to follow up in 6 weeks Provider spent a total of 18 minutes with the patient/reviewing patient's chart  Meta Hatchet, PA 01/19/2023, 12:07 PM

## 2023-01-19 ENCOUNTER — Encounter (HOSPITAL_COMMUNITY): Payer: Self-pay | Admitting: Physician Assistant

## 2023-01-29 ENCOUNTER — Other Ambulatory Visit: Payer: Self-pay | Admitting: Neurology

## 2023-02-07 ENCOUNTER — Encounter (HOSPITAL_COMMUNITY): Payer: Self-pay

## 2023-02-09 ENCOUNTER — Encounter (HOSPITAL_COMMUNITY): Payer: Self-pay

## 2023-02-10 ENCOUNTER — Ambulatory Visit (HOSPITAL_COMMUNITY): Payer: PRIVATE HEALTH INSURANCE | Admitting: Licensed Clinical Social Worker

## 2023-02-10 ENCOUNTER — Telehealth (HOSPITAL_COMMUNITY): Payer: Self-pay | Admitting: Licensed Clinical Social Worker

## 2023-02-10 ENCOUNTER — Encounter (HOSPITAL_COMMUNITY): Payer: Self-pay

## 2023-02-10 NOTE — Telephone Encounter (Signed)
LCSW sent links to pt phone at 1301 and [phone] with no response. LCSW waited until 1315 before disconnecting.

## 2023-03-01 ENCOUNTER — Encounter (HOSPITAL_COMMUNITY): Payer: 59 | Admitting: Physician Assistant

## 2023-03-01 ENCOUNTER — Ambulatory Visit (INDEPENDENT_AMBULATORY_CARE_PROVIDER_SITE_OTHER): Payer: 59 | Admitting: Family Medicine

## 2023-03-01 ENCOUNTER — Encounter: Payer: Self-pay | Admitting: Family Medicine

## 2023-03-01 VITALS — BP 107/70 | HR 54 | Temp 98.0°F | Resp 16 | Ht 69.0 in | Wt 130.4 lb

## 2023-03-01 DIAGNOSIS — G40909 Epilepsy, unspecified, not intractable, without status epilepticus: Secondary | ICD-10-CM | POA: Diagnosis not present

## 2023-03-01 DIAGNOSIS — F5102 Adjustment insomnia: Secondary | ICD-10-CM

## 2023-03-01 DIAGNOSIS — F331 Major depressive disorder, recurrent, moderate: Secondary | ICD-10-CM | POA: Diagnosis not present

## 2023-03-01 MED ORDER — HYDROXYZINE PAMOATE 25 MG PO CAPS
25.0000 mg | ORAL_CAPSULE | Freq: Every evening | ORAL | 1 refills | Status: DC | PRN
Start: 2023-03-01 — End: 2023-05-30

## 2023-03-01 NOTE — Assessment & Plan Note (Signed)
Newer diagnosis, but going on 4 to 6 months.  Seems to center around his seizure disorder.  Will trial hydroxyzine 25 mg at at bedtime.  Also, discussed sleep hygiene.

## 2023-03-01 NOTE — Assessment & Plan Note (Signed)
Chronic, but flaring.  Understandably, very upset about seizure disorder and how it has disrupted his life.  Discussed that it may just be a different avenue/stepping stone for his life.  Understand his frustrations.  He is working on getting established with a Veterinary surgeon.  Also has great support from his family.  Just started on Zoloft through mental health.  He will continue to follow with them.  Advised if he starts getting a suicidal plan, things worsen, go to mental health hospital

## 2023-03-01 NOTE — Progress Notes (Signed)
Subjective:     Patient ID: John King, male    DOB: April 08, 1995, 27 y.o.   MRN: [id number]  Chief Complaint  Patient presents with   Medical Management of Chronic Issues    3 month follow-up on seizures     HPI-here w/mom  Sz disorder-taking topamax.  Working w/neuro.  Had sz in Oct after drinking ETOH.  Also, stress seems to trigger.Marland Kitchen  Keppra caused irrit, zonasamide-emotional/crying/SI.   Wt loss- improved.  Eating more. Depression-  went to behavioral health hosp in Sept.  Got rx for zoloft, but didn't take at first.  Then insomnia so started taking past 1 month. Didn't connect on virtual visit so no counseling yetl  seeing provider for rx-has f/u after Christmas.  Has ST but no plan and won't.  Will get sch w/counsellor. insomnia past 4-5 months.  Mind going. Worries about sz   Will fall asleep 630am and up at 1pm. Some nights, better.  No mania.  There are no preventive care reminders to display for this patient.   Past Medical History:  Diagnosis Date   Seizures (HCC) 02-05-22    Past Surgical History:  Procedure Laterality Date   MOUTH SURGERY       Current Outpatient Medications:    clindamycin (CLINDAGEL) 1 % gel, Apply topically 2 (two) times daily., Disp: 30 g, Rfl: 4   diazePAM, 15 MG Dose, (VALTOCO 15 MG DOSE) 2 x 7.5 MG/0.1ML LQPK, Administer one spray in one nostril, other spray in other nostril (one dose) as needed for seizure. May use another dose 4 hours after if needed., Disp: 5 each, Rfl: 5   hydrOXYzine (VISTARIL) 25 MG capsule, Take 1 capsule (25 mg total) by mouth at bedtime as needed., Disp: 30 capsule, Rfl: 1   sertraline (ZOLOFT) 25 MG tablet, Take 1 tablet (25 mg total) by mouth daily., Disp: 30 tablet, Rfl: 1   topiramate (TOPAMAX) 100 MG tablet, Take 1 tablet (100 mg total) by mouth 2 (two) times daily., Disp: 60 tablet, Rfl: 6  No Known Allergies ROS neg/noncontributory except as noted HPI/below Nervous at times and chest tight-for 1  month.        Objective:     BP 107/70   Pulse (!) 54   Temp 98 F (36.7 C) (Temporal)   Resp 16   Ht 5\' 9"  (1.753 m)   Wt 130 lb 6 oz (59.1 kg)   SpO2 100%   BMI 19.25 kg/m  Wt Readings from Last 3 Encounters:  03/01/23 130 lb 6 oz (59.1 kg)  01/18/23 131 lb 3.2 oz (59.5 kg)  12/16/22 128 lb 9.6 oz (58.3 kg)    Physical Exam   Gen: WDWN NAD HEENT: NCAT, conjunctiva not injected, sclera nonicteric NECK:  supple, no thyromegaly, no nodes, no carotid bruits CARDIAC: RRR, S1S2+, no murmur. LUNGS: CTAB. No wheezes EXT:  no edema MSK: no gross abnormalities.  NEURO: A&O x3.  CN II-XII intact.  PSYCH: tearful. Good eye contact  Reviewed MH notes     Assessment & Plan:  Seizure disorder [facility]) Assessment & Plan: Chronic.  Fair control on topamax 100mg  bid.  Breaks thru if too much ETOH so aware not to do any more.  Also, if excessive stress.     Adjustment insomnia Assessment & Plan: Newer diagnosis, but going on 4 to 6 months.  Seems to center around his seizure disorder.  Will trial hydroxyzine 25 mg at at bedtime.  Also, discussed sleep hygiene.  Orders: -     hydrOXYzine Pamoate; Take 1 capsule (25 mg total) by mouth at bedtime as needed.  Dispense: 30 capsule; Refill: 1  Moderate episode of recurrent major depressive disorder (HCC) Assessment & Plan: Chronic, but flaring.  Understandably, very upset about seizure disorder and how it has disrupted his life.  Discussed that it may just be a different avenue/stepping stone for his life.  Understand his frustrations.  He is working on getting established with a Veterinary surgeon.  Also has great support from his family.  Just started on Zoloft through mental health.  He will continue to follow with them.  Advised if he starts getting a suicidal plan, things worsen, go to mental health hospital    Return in about 3 months (around 05/30/2023) for chronic follow-up.  Angelena Sole, MD

## 2023-03-01 NOTE — Patient Instructions (Addendum)
It was very nice to see you today!  Hydroxyzine 1 hr before bed.  Merry Christmas!!!  You are important   PLEASE NOTE:  If you had any lab tests please let us know if you have not heard back within a few days. You may see your results on MyChart before we have a chance to review them but we will give you a call once they are reviewed by [place]. If we ordered any referrals today, please let us know if you have not heard from their office within the next week.   Please try these tips to maintain a healthy lifestyle:  Eat most of your calories during the day when you are active. Eliminate processed foods including packaged sweets (pies, cakes, cookies), reduce intake of potatoes, white bread, white pasta, and white rice. Look for whole grain options, oat flour or almond flour.  Each meal should contain half fruits/vegetables, one quarter protein, and one quarter carbs (no bigger than a computer mouse).  Cut down on sweet beverages. This includes juice, soda, and sweet tea. Also watch fruit intake, though this is a healthier sweet option, it still contains natural sugar! Limit to 3 servings daily.  Drink at least 1 glass of water with each meal and aim for at least 8 glasses per day  Exercise at least 150 minutes every week.

## 2023-03-01 NOTE — Assessment & Plan Note (Signed)
Chronic.  Fair control on topamax 100mg  bid.  Breaks thru if too much ETOH so aware not to do any more.  Also, if excessive stress.

## 2023-03-22 ENCOUNTER — Encounter (HOSPITAL_COMMUNITY): Payer: Medicaid Other | Admitting: Physician Assistant

## 2023-03-24 ENCOUNTER — Encounter (HOSPITAL_COMMUNITY): Payer: 59 | Admitting: Physician Assistant

## 2023-04-04 ENCOUNTER — Telehealth: Payer: 59 | Admitting: Neurology

## 2023-04-04 ENCOUNTER — Encounter: Payer: Self-pay | Admitting: Neurology

## 2023-04-04 VITALS — Ht 69.5 in | Wt 135.0 lb

## 2023-04-04 DIAGNOSIS — G40009 Localization-related (focal) (partial) idiopathic epilepsy and epileptic syndromes with seizures of localized onset, not intractable, without status epilepticus: Secondary | ICD-10-CM

## 2023-04-04 MED ORDER — TOPIRAMATE 100 MG PO TABS: 100.0000 mg | ORAL_TABLET | Freq: Two times a day (BID) | ORAL | 11 refills | Status: DC

## 2023-04-04 NOTE — Progress Notes (Signed)
 Virtual Visit via Video Note The purpose of this virtual visit is to provide medical care while limiting exposure to the novel coronavirus.    Consent was obtained for video visit:  Yes.   Answered questions that patient had about telehealth interaction:  Yes.    Pt location: Home Physician Location: office Name of referring provider:  Wendolyn Jenkins Jansky, MD I connected with John King at patients initiation/request on 04/04/2023 at  4:00 PM EST by video enabled telemedicine application and verified that I am speaking with the correct person using two identifiers. Pt MRN:  [id number] Pt DOB:  1995/08/18 Video Participants:  John King   History of Present Illness:  The patient had a virtual video visit on 04/04/2023. He was last seen in the neurology clinic 4 months ago for recurrent seizures. He is alone for today's visit. Since his last visit, he had one seizure in October 2024. He states he was out drinking the night before and thinks he was sick that time. No injuries. He is on Topiramate  100mg  BID which he is tolerating with only occasional paresthesias in his fingers and toes. He denies any staring/unresponsive episodes, gaps in time, olfactory/gustatory hallucinations, focal numbness/tingling/weakness, myoclonic jerks. He denies any significant headaches. He was sick with a GI bug in December, vomiting and without the Topiramate , he had headaches that time. No dizziness, vision changes, no falls. He gets 6-7 hours of sleep when he falls asleep. Mood is up and down, he feels bored. Appetite is good, no weight loss.    History on Initial Assessment 03/24/2022: This is a 28 year old right-handed man presenting for evaluation of new onset seizures. His mother is present to provide additional information. He was in his usual state of health until 02/04/22. He recalls sleeping beside his 29 year old, woke up an hour later feeling fine and going back to sleep. He then woke up and saw the  girl's mother with blood around the room. The child had called her mother, he apparently got up from bed, went to the bathroom, then as he came out he fell and had a seizure. He hit the left side of his head and then went back to bed but has no recollection of this. He had bit his tongue, urinary incontinence, and required staples for the head injury. He was brought to the ER where bloodwork showed a WBC of 12, EtOH 10. Head CT no acute changes, venous sinuses were noted to be mildly hyperdense, nonspecific but can be seen in the setting of dehydration. He then had another seizure on 03/15/22. He recalls feeling tired the day prior. He was visiting a friend who told him that something was off when he got there, he went to sleep then woke up to EMS around him. He was brought to [facility], per ER notes, girlfriend woke up to patient having a seizure in bed, he bit his tongue and had nausea/vomiting. Head CT noted a vague small focus of hypoattenuation in the right frontal lobe that is favored to be artifactual. Bloodwork normal. He was discharged home on Levetiracetam  750mg  BID.He reports missing 2 doses the day before Christmas then having another nocturnal seizure on 03/22/22. He again bit his tongue. He has some drowsiness with the Levetiracetam . He has occasional body twitches in his head, neck, and back that he attributes to bulging discs. His legs would just jump uncontrollable sometimes, twitching and trembling. After a long day, his head may twitch uncontrollably.  No associated confusion. This started around are 22 or 23. He has occasional episodes of deja vu. No staring/unresponsive episodes, focal numbness/tingling/weakness, olfactory/gustatory hallucinations. He used to have migraines but denies any more headaches. No dizziness, diplopia, dysarthria/dysphagia, bowel/bladder dysfunction. He usually gets 7-9 hours of sleep. He drinks 2-3 liquor beverages daily. He smokes marijuana. He denies any alcohol  prior to the first seizure but recalls having a drink the night before the second and third seizures. He lives with his mother. Mood is fine.   Epilepsy Risk Factors:  His mother had one seizure and takes Topiramate . HIs father had 2 alcohol-related seizures. He had a normal birth and early development.  There is no history of febrile convulsions, CNS infections such as meningitis/encephalitis, significant traumatic brain injury, neurosurgical procedures, or family history of seizures.  Prior ASMs: Levetiracetam  (mood changes), Zonisamide  (waves of emotions he breaks down and cries everyday)     Current Outpatient Medications on File Prior to Visit  Medication Sig Dispense Refill   clindamycin  (CLINDAGEL) 1 % gel Apply topically 2 (two) times daily. 30 g 4   diazePAM , 15 MG Dose, (VALTOCO  15 MG DOSE) 2 x 7.5 MG/0.1ML LQPK Administer one spray in one nostril, other spray in other nostril (one dose) as needed for seizure. May use another dose 4 hours after if needed. 5 each 5   hydrOXYzine  (VISTARIL ) 25 MG capsule Take 1 capsule (25 mg total) by mouth at bedtime as needed. 30 capsule 1   topiramate  (TOPAMAX ) 100 MG tablet Take 1 tablet (100 mg total) by mouth 2 (two) times daily. 60 tablet 6   sertraline  (ZOLOFT ) 25 MG tablet Take 1 tablet (25 mg total) by mouth daily. (Patient not taking: Reported on 04/04/2023) 30 tablet 1   No current facility-administered medications on file prior to visit.     Observations/Objective:   GEN:  The patient appears stated age and is in NAD.  Neurological examination: Patient is awake, alert. No aphasia or dysarthria. Intact fluency and comprehension. Cranial nerves: Extraocular movements intact with no nystagmus. No facial asymmetry. Motor: moves all extremities symmetrically, at least anti-gravity x 4. Gait: narrow-based and steady.   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 Brain MRI and EEG normal. He denies any seizures since October 2024, continue Topiramate  100mg  BID. We again discussed avoidance of seizure triggers, including missing medication, alcohol, sleep deprivation. He is aware of [place] driving laws to stop driving after a seizure until 6 months seizure-free. Follow-up in 6 months, call for any changes.    Follow Up Instructions:    -I discussed the assessment and treatment plan with the patient. The patient was provided an opportunity to ask questions and all were answered. The patient agreed with the plan and demonstrated an understanding of the instructions.   The patient was advised to call back or seek an in-person evaluation if the symptoms worsen or if the condition fails to improve as anticipated.    Darice CHRISTELLA Shivers, MD

## 2023-04-04 NOTE — Patient Instructions (Signed)
 Good to see you. Continue Topiramate  100mg  twice a day. Follow-up in 6 months, call for any changes.   Seizure Precautions: 1. If medication has been prescribed for you to prevent seizures, take it exactly as directed.  Do not stop taking the medicine without talking to your doctor first, even if you have not had a seizure in a long time.   2. Avoid activities in which a seizure would cause danger to yourself or to others.  Don't operate dangerous machinery, swim alone, or climb in high or dangerous places, such as on ladders, roofs, or girders.  Do not drive unless your doctor says you may.  3. If you have any warning that you may have a seizure, lay down in a safe place where you can't hurt yourself.    4.  No driving for 6 months from last seizure, as per [place]  state law.   Please refer to the following link on the Epilepsy Foundation of America's website for more information: http://www.epilepsyfoundation.org/answerplace/Social/driving/drivingu.cfm   5.  Maintain good sleep hygiene. Avoid alcohol.  6.  Contact your doctor if you have any problems that may be related to the medicine you are taking.  7.  Call 911 and bring the patient back to the ED if:        A.  The seizure lasts longer than 5 minutes.       B.  The patient doesn't awaken shortly after the seizure  C.  The patient has new problems such as difficulty seeing, speaking or moving  D.  The patient was injured during the seizure  E.  The patient has a temperature over 102 F (39C)  F.  The patient vomited and now is having trouble breathing

## 2023-04-18 ENCOUNTER — Encounter: Payer: Self-pay | Admitting: Neurology

## 2023-04-26 ENCOUNTER — Ambulatory Visit (INDEPENDENT_AMBULATORY_CARE_PROVIDER_SITE_OTHER): Payer: Medicaid Other | Admitting: Physician Assistant

## 2023-04-26 VITALS — BP 125/54 | HR 69 | Temp 97.8°F | Ht 69.5 in | Wt 140.2 lb

## 2023-04-26 DIAGNOSIS — F411 Generalized anxiety disorder: Secondary | ICD-10-CM

## 2023-04-26 DIAGNOSIS — F331 Major depressive disorder, recurrent, moderate: Secondary | ICD-10-CM

## 2023-04-26 NOTE — Progress Notes (Signed)
BH MD/PA/NP OP Progress Note  04/26/2023 3:48 PM John King  MRN:  [id number]  Chief Complaint:  Chief Complaint  Patient presents with   Follow-up   HPI:   John King is a 28 year old male with a past psychiatric history significant for major depressive disorder (moderate episode, recurrent) and generalized anxiety disorder presents to [facility] for follow-up and medication management.  Patient is currently being managed on the following psychiatric medication: Sertraline 50 mg daily.  Patient reports that things in his life have gotten better.  Patient reports that he would like to enroll in therapy stating that his previous therapist never got in contact with him.  Patient denies any issues or concerns regarding his use of sertraline.  Though the medication has been helpful in managing his mood, he feels that he does not need to take it anymore.  He reports that he is normally an active individual and would like to be more active.  Patient continues to endorse depression on and off and rates his depression as 5 or 6 out of 10 with 10 being most severe.  Patient endorses depressive episodes 3 to 4 days out of the week.  He attributes his depression to staying in the house.  He reports that he is currently unemployed but still works at [employer] on the weekends.  Patient endorses feelings of sadness attributed to a recent death that occurred within his family.  He also reports that his mother recently lost someone as well.  He endorses some anxiety but states that it is less than before.  A PHQ-9 screen was performed with the patient scoring a 17.  A GAD-7 screen was also performed with the patient scoring a 12.  Patient is alert and oriented x 4, calm, cooperative, and fully engaged in conversation during the encounter.  Patient endorses mellow mood and states that he is going with the flow.  Patient exhibits depressed mood with  appropriate affect.  Patient denies suicidal or homicidal ideations.  Patient denies auditory or visual hallucinations and does not appear to be responding to internal/external stimuli.  Patient endorses good sleep and receives on average 7 to 8 hours of sleep per night.  Patient endorses good appetite and eats on average 2-3 meals per day.  Patient endorses alcohol consumption and drinks on average 1-2 drinks per week.  Patient endorses tobacco use stating that he often uses tobacco when smoking marijuana.  Visit Diagnosis:    ICD-10-CM   1. Moderate episode of recurrent major depressive disorder (HCC)  F33.1     2. Generalized anxiety disorder  F41.1        Past Psychiatric History:  Patient has a past psychiatric history significant for major depressive disorder and generalized anxiety disorder  Patient denies a past history of hospitalization due to mental health   Patient denies a past history of suicide attempt   Patient denies a past history of homicide attempts  Past Medical History:  Past Medical History:  Diagnosis Date   Seizures (HCC) 02-05-22    Past Surgical History:  Procedure Laterality Date   MOUTH SURGERY      Family Psychiatric History:  Patient denies a family history of psychiatric illness   Family history of suicide attempt: Patient denies, but states that he lost a friend to suicide Family history of homicide attempt: Patient denies Family history of substance abuse: Patient reports that his father and uncle are alcoholics  Family  History:  Family History  Problem Relation Age of Onset   Seizures Mother        one only in HS   Cancer Mother 40       breast   Hypertension Mother    Hypertension Father    Alcohol abuse Father    Seizures Father        etoh withdrawal   CAD Other        50-60's. maternal side    Social History:  Social History   Socioeconomic History   Marital status: Single    Spouse name: Not on file   Number of  children: 0   Years of education: Not on file   Highest education level: Not on file  Occupational History   Not on file  Tobacco Use   Smoking status: Every Day    Types: Cigars   Smokeless tobacco: Never  Vaping Use   Vaping status: Every Day  Substance and Sexual Activity   Alcohol use: Yes    Alcohol/week: 7.0 standard drinks of alcohol    Types: 7 Shots of liquor per week    Comment: 2-3 on weekends   Drug use: Yes    Types: Marijuana    Comment: Daily   Sexual activity: Yes    Birth control/protection: None  Other Topics Concern   Not on file  Social History Narrative   Unem.  1 step dau.   Occ hosts parties on weekends.   Are you right handed or left handed? Right    Are you currently employed ? No    What is your current occupation?   Do you live at home alone? no   Who lives with you? With family    What type of home do you live in: 1 story or 2 story?  15 steps        Social Drivers of Corporate investment banker Strain: Not on file  Food Insecurity: Not on file  Transportation Needs: Not on file  Physical Activity: Not on file  Stress: Not on file  Social Connections: Not on file    Allergies: No Known Allergies  Metabolic Disorder Labs: No results found for: "HGBA1C", "MPG" No results found for: "PROLACTIN" No results found for: "CHOL", "TRIG", "HDL", "CHOLHDL", "VLDL", "LDLCALC" Lab Results  Component Value Date   TSH 0.61 08/04/2022   TSH 0.33 (L) 03/02/2022    Therapeutic Level Labs: No results found for: "LITHIUM" No results found for: "VALPROATE" No results found for: "CBMZ"  Current Medications: Current Outpatient Medications  Medication Sig Dispense Refill   clindamycin (CLINDAGEL) 1 % gel Apply topically 2 (two) times daily. 30 g 4   diazePAM, 15 MG Dose, (VALTOCO 15 MG DOSE) 2 x 7.5 MG/0.1ML LQPK Administer one spray in one nostril, other spray in other nostril (one dose) as needed for seizure. May use another dose 4 hours after if  needed. 5 each 5   hydrOXYzine (VISTARIL) 25 MG capsule Take 1 capsule (25 mg total) by mouth at bedtime as needed. 30 capsule 1   sertraline (ZOLOFT) 25 MG tablet Take 1 tablet (25 mg total) by mouth daily. (Patient not taking: Reported on 04/04/2023) 30 tablet 1   topiramate (TOPAMAX) 100 MG tablet Take 1 tablet (100 mg total) by mouth 2 (two) times daily. 60 tablet 11   No current facility-administered medications for this visit.     Musculoskeletal: Strength & Muscle Tone: within normal limits Gait & Station: normal  Patient leans: N/A  Psychiatric Specialty Exam: Review of Systems  Psychiatric/Behavioral:  Negative for decreased concentration, dysphoric mood, hallucinations, self-injury, sleep disturbance and suicidal ideas. The patient is nervous/anxious. The patient is not hyperactive.     Blood pressure (!) 125/54, pulse 69, temperature 97.8 F (36.6 C), temperature source Oral, height 5' 9.5" (1.765 m), weight 140 lb 3.2 oz (63.6 kg), SpO2 100%.Body mass index is 20.41 kg/m.  General Appearance: Casual  Eye Contact:  Good  Speech:  Clear and Coherent and Normal Rate  Volume:  Normal  Mood:  Anxious and Depressed  Affect:  Appropriate  Thought Process:  Coherent, Goal Directed, and Descriptions of Associations: Intact  Orientation:  Full (Time, Place, and Person)  Thought Content: WDL   Suicidal Thoughts:  No  Homicidal Thoughts:  No  Memory:  Immediate;   Good Recent;   Good Remote;   Good  Judgement:  Fair  Insight:  Present  Psychomotor Activity:  Normal  Concentration:  Concentration: Good and Attention Span: Good  Recall:  Good  Fund of Knowledge: Good  Language: Good  Akathisia:  No  Handed:  Right  AIMS (if indicated): not done  Assets:  Communication Skills Desire for Improvement Housing Social Support Transportation Vocational/Educational  ADL's:  Intact  Cognition: WNL  Sleep:  Good   Screenings: GAD-7    Flowsheet Row Clinical Support from  04/26/2023 in [facility] Office Visit from 03/01/2023 in [facility] Clinical Support from 01/18/2023 in [facility] Office Visit from 12/16/2022 in [facility] Office Visit from 12/15/2022 in [facility]  Total GAD-7 Score 12 12 12 19 19       PHQ2-9    Flowsheet Row Clinical Support from 04/26/2023 in [facility] Office Visit from 03/01/2023 in [facility] Clinical Support from 01/18/2023 in [facility] Office Visit from 12/16/2022 in [facility] Office Visit from 12/15/2022 in [facility]  PHQ-2 Total Score 4 4 3 5 5   PHQ-9 Total Score 17 14 15 21 24       Flowsheet Row Clinical Support from 04/26/2023 in [facility] Clinical Support from 01/18/2023 in [facility] Office Visit from 12/16/2022 in [facility]  C-SSRS RISK CATEGORY Low Risk High Risk High Risk        Assessment and Plan:   John King is a 28 year old male with a past psychiatric history significant for major depressive disorder (moderate episode, recurrent) and generalized anxiety disorder presents to [facility] for follow-up and medication management.  Patient presents to the encounter stating that he has been doing better since being on sertraline.  Though he endorses doing better while on the medication, patient states that he would like to discontinue his use of sertraline and try to find other means to help manage his depression and anxiety.  Patient reports that he is normally an active individual and states that he has been more depressed since being at home more.  Patient  states that he would like to be more active in hopes of improving his depression and anxiety.  In addition to becoming more active, patient states that he would like to reenroll in therapy.  He reports that his last  therapist never contacted him so he was unable to do his appointment.  Provider to set patient up with a licensed clinical social worker at this facility following the conclusion of the encounter.  Provider informed patient that he could set up a follow-up appointment if he wanted to be placed back on medications.  Collaboration of Care: Collaboration of Care: Medication Management AEB provider managing patient's psychiatric medications, Psychiatrist AEB patient being seen by a mental health provider at this facility, Other provider involved in patient's care AEB being seen by neurology, and Referral or follow-up with counselor/therapist AEB patient being seen by licensed clinical social worker at this facility  Patient/Guardian was advised Release of Information must be obtained prior to any record release in order to collaborate their care with an outside provider. Patient/Guardian was advised if they have not already done so to contact the registration department to sign all necessary forms in order for [place] to release information regarding their care.   Consent: Patient/Guardian gives verbal consent for treatment and assignment of benefits for services provided during this visit. Patient/Guardian expressed understanding and agreed to proceed.   1. Moderate episode of recurrent major depressive disorder (HCC) (Primary) Patient refused to continue taking sertraline following the conclusion of the encounter Patient would like to continue with therapy moving forward  2. Generalized anxiety disorder Patient refused to continue taking sertraline following the conclusion of the encounter Patient would like to continue with therapy moving forward  Patient to follow up in 6 weeks Provider  spent a total of 15 minutes with the patient/reviewing patient's chart  Meta Hatchet, PA 04/26/2023, 3:48 PM

## 2023-04-27 ENCOUNTER — Encounter (HOSPITAL_COMMUNITY): Payer: Self-pay | Admitting: Physician Assistant

## 2023-05-30 ENCOUNTER — Telehealth (INDEPENDENT_AMBULATORY_CARE_PROVIDER_SITE_OTHER): Payer: Medicaid Other | Admitting: Family Medicine

## 2023-05-30 ENCOUNTER — Encounter: Payer: Self-pay | Admitting: Family Medicine

## 2023-05-30 DIAGNOSIS — G40909 Epilepsy, unspecified, not intractable, without status epilepticus: Secondary | ICD-10-CM | POA: Diagnosis not present

## 2023-05-30 DIAGNOSIS — F331 Major depressive disorder, recurrent, moderate: Secondary | ICD-10-CM | POA: Diagnosis not present

## 2023-05-30 DIAGNOSIS — F5102 Adjustment insomnia: Secondary | ICD-10-CM

## 2023-05-30 DIAGNOSIS — L749 Eccrine sweat disorder, unspecified: Secondary | ICD-10-CM | POA: Diagnosis not present

## 2023-05-30 NOTE — Progress Notes (Signed)
 MyChart Video Visit Virtual Visit via Video Note   This visit type was conducted w/patient consent. This format is felt to be most appropriate for this patient at this time. Physical exam was limited by quality of the video and audio technology used for the visit. CMA was able to get the patient set up on a video visit.  Patient location: Home. Patient and provider in visit Provider location: Office  I discussed the limitations of evaluation and management by telemedicine and the availability of in person appointments. The patient expressed understanding and agreed to proceed.  Visit Date: 05/30/2023  Today's healthcare provider: Angelena Sole, MD     Subjective:    Patient ID: John King, male    DOB: [birth year], 28 y.o.   MRN: [id number]  Chief Complaint  Patient presents with   Medical Management of Chronic Issues    3 month follow-up    HPI Sz disorder-taking topamax.  Working w/neuro.  Had sz in Oct after drinking ETOH.  Also, stress seems to trigger.Marland Kitchen  Keppra caused irrit, zonasamide-emotional/crying/SI.   Occurred 2/26 last episode-sweats. Wt loss- improved.  Eating more. Wt 135-140.  More active Depression-  [facility].  Got rx for zoloft, but didn't take at first.  Waiting counselor insomnia long time.  Mind going. Worries about sz   Will fall asleep 630am and up at 1pm. Some nights, better.  No mania.  Discussed the use of AI scribe software for clinical note transcription with the patient, who gave verbal consent to proceed.  History of Present Illness   GIVANNI King is a 28 year old male with a history of seizures who presents with a possible seizure episode. He is accompanied by his father, Mr. Fricker.  He experienced a possible seizure episode last week, which he is unsure about. His father describes the event as occurring on a Wednesday afternoon, where he was found unresponsive, with sweating and some saliva on the side of  his mouth. There was no shaking, and the episode lasted about six to seven minutes. He did not recall losing urine or biting his tongue during the episode. He recalls vomiting slightly after the recent episode and then sleeping for the rest of the day. he is taking topamax 100mg  bid.  on day of even, he was unable to sleep night prior, ate light breakfast and then fell asleep some time after 6am.  event occurred about 3pm  He has stopped taking Zoloft 25 mg daily and hydroxyzine for anxiety at bedtime. He experiences ongoing depression, which he describes as 'touch and go.' He has not yet connected with a counselor but has an appointment scheduled for the 21st. No suicidal thoughts, but he finds it challenging to stay at home without work. He is actively seeking employment but has not been successful yet. Limited by not being able to drive d/t sz  He reports some difficulty sleeping,   There is a family history of diabetes, with his grandmother and mother having the condition. He himself is not diabetic. He has not had blood work done since May of the previous year.  He reports occasional light alcohol consumption, which is less frequent than before. He has been eating better and is trying to maintain his weight around 135-140 pounds. He is attempting to start a light exercise routine at home.       Past Medical History:  Diagnosis Date   Seizures (HCC) 02-05-22  Past Surgical History:  Procedure Laterality Date   MOUTH SURGERY      Outpatient Medications Prior to Visit  Medication Sig Dispense Refill   clindamycin (CLINDAGEL) 1 % gel Apply topically 2 (two) times daily. 30 g 4   diazePAM, 15 MG Dose, (VALTOCO 15 MG DOSE) 2 x 7.5 MG/0.1ML LQPK Administer one spray in one nostril, other spray in other nostril (one dose) as needed for seizure. May use another dose 4 hours after if needed. 5 each 5   ondansetron (ZOFRAN-ODT) 4 MG disintegrating tablet Take 4 mg by mouth every 8 (eight) hours  as needed.     topiramate (TOPAMAX) 100 MG tablet Take 1 tablet (100 mg total) by mouth 2 (two) times daily. 60 tablet 11   hydrOXYzine (VISTARIL) 25 MG capsule Take 1 capsule (25 mg total) by mouth at bedtime as needed. 30 capsule 1   sertraline (ZOLOFT) 25 MG tablet Take 1 tablet (25 mg total) by mouth daily. 30 tablet 1   No facility-administered medications prior to visit.    No Known Allergies      Objective:     Physical Exam  Vitals and nursing note reviewed.  Constitutional:      General:  is not in acute distress.    Appearance: Normal appearance.  HENT:     Head: Normocephalic.  Pulmonary:     Effort: No respiratory distress.  Skin:    General: Skin is dry.     Coloration: Skin is not pale.  Neurological:     Mental Status: Pt is alert and oriented to person, place, and time.  Psychiatric:        Mood and Affect: Mood normal.   There were no vitals taken for this visit.  Wt Readings from Last 3 Encounters:  04/04/23 135 lb (61.2 kg)  03/01/23 130 lb 6 oz (59.1 kg)  12/15/22 131 lb 3.2 oz (59.5 kg)       Assessment & Plan:   Problem List Items Addressed This Visit     Adjustment insomnia   Moderate episode of recurrent major depressive disorder (HCC)   Seizure disorder (HCC) - Primary   Other Visit Diagnoses       Sweating abnormality         Assessment and Plan    Seizure Disorder Managed with Topamax 100 mg twice daily. He reported a possible seizure episode last week characterized by sweating, unresponsiveness, and salivation without shaking, lasting 6-7 minutes. Differential diagnosis includes hypoglycemia due to irregular eating and sleeping patterns. No seizures have occurred since October, which was associated with alcohol consumption. Discussed potential hypoglycemia and advised having glucose solution or tabs available. Emphasized the importance of regular meals and adequate sleep to prevent hypoglycemia. Continue Topamax 100 mg twice daily.  Monitor for further episodes and consider glucose solution or tabs for potential hypoglycemia. Ensure regular meals and adequate sleep. Schedule follow-up with neurologist in July, consider moving up if necessary.  not sure if this was a sz, anxiety, other.   Depression Managed without medication after discontinuing Zoloft and hydroxyzine(didn't really take them). A counseling appointment is scheduled for March 21. He denies suicidal ideation but finds it challenging to stay at home without work. Discussed the importance of regular follow-up with a mental health provider and seeking employment to improve mental health and daily structure. Attend counseling appointment on March 21. Encourage regular follow-up with a mental health provider. Continue to seek employment to improve mental health and daily structure.  General Health Maintenance He aims to increase weight from 135-140 lbs and is starting light exercise at home. Advised to ensure proper nutrition, especially before exercising, to prevent hypoglycemia. Maintain regular meals and consider protein drinks if needed. Monitor weight regularly. Ensure proper nutrition before exercising.  Follow-up Schedule a follow-up appointment in six months. Contact the office if any new symptoms or concerns arise.        No orders of the defined types were placed in this encounter.   I discussed the assessment and treatment plan with the patient. The patient was provided an opportunity to ask questions and all were answered. The patient agreed with the plan and demonstrated an understanding of the instructions.   The patient was advised to call back or seek an in-person evaluation if the symptoms worsen or if the condition fails to improve as anticipated.  Return in about 6 months (around 11/30/2023) for chronic follow-up.  Angelena Sole, MD [facility] [phone] (phone) [phone] (fax)  [facility]

## 2023-05-30 NOTE — Patient Instructions (Signed)

## 2023-06-06 ENCOUNTER — Telehealth: Payer: Self-pay | Admitting: Neurology

## 2023-06-06 NOTE — Telephone Encounter (Signed)
 No answer at 3:08pm 06/06/2023

## 2023-06-06 NOTE — Telephone Encounter (Signed)
 Caller stated he would like to touch base with Dr Karel Jarvis concerning updates to his last seizure. Stated his six month driving period is coming up next month and he would like to speak with her before then.

## 2023-06-07 NOTE — Telephone Encounter (Signed)
Pt called no answer unable to leave a voice mail  

## 2023-06-08 NOTE — Telephone Encounter (Signed)
 [phone] father phone number

## 2023-06-08 NOTE — Telephone Encounter (Signed)
 Last seizure was in October that was reported had an episode last week was unresponsive was about 4-5 mins  he wasn't shaking wasn't unsure if blood sugar had dropped.   Pt c/o: seizure Missed medications?  No. Sleep deprived?  Yes.   Some night he doesn't sleep. That happened in the after noon he had just started to go to sleep  Alcohol intake?  Yes.   Some its light normally on the weekends  Increased stress? No. Its been a decrease from last time he talked to you  Any change in medication color or shape? No. Notice any trigger? NO Back to their usual baseline self?  Yes . If no, advise go to ER Current medications prescribed by Dr. Karel Jarvis:   topiramate (TOPAMAX) 100 MG tablet  Take 1 tablet (100 mg total) by mouth 2 (two) times daily  VALTOCO 15 MG  as needed   Call back on fathers phone [phone]

## 2023-06-08 NOTE — Telephone Encounter (Signed)
Pt called no answer unable to leave a voice mail  

## 2023-06-09 NOTE — Telephone Encounter (Signed)
 Scheduled fro 3/20 to discuss

## 2023-06-15 ENCOUNTER — Encounter (HOSPITAL_COMMUNITY): Payer: Self-pay

## 2023-06-16 ENCOUNTER — Encounter: Payer: Self-pay | Admitting: Neurology

## 2023-06-16 ENCOUNTER — Ambulatory Visit (INDEPENDENT_AMBULATORY_CARE_PROVIDER_SITE_OTHER): Admitting: Neurology

## 2023-06-16 VITALS — BP 106/42 | HR 75 | Ht 70.0 in | Wt 137.0 lb

## 2023-06-16 DIAGNOSIS — G40009 Localization-related (focal) (partial) idiopathic epilepsy and epileptic syndromes with seizures of localized onset, not intractable, without status epilepticus: Secondary | ICD-10-CM

## 2023-06-16 MED ORDER — TOPIRAMATE 100 MG PO TABS: ORAL_TABLET | ORAL | 11 refills | Status: DC

## 2023-06-16 NOTE — Progress Notes (Incomplete)
 NEUROLOGY FOLLOW UP OFFICE NOTE  John King [id number] 09/01/1995  HISTORY OF PRESENT ILLNESS: I had the pleasure of seeing Mamie Diiorio in follow-up in the neurology clinic on 06/16/2023.  He is alone in the office today. The patient was last seen 2 months ago for seizures and presents for an earlier visit due to recent episode that occurred 05/24/23. He reports that he did not have any sleep the night prior, he went to sleep in the morning and his father heard a noise in his room at noon. He was lying in bed wrapped up in a blanket, sweating, eyes open but unresponsive. His father administered Valtoco nasal spray and he woke up after. He recalls getting up at 3pm with nausea/vomiting of saliva a few times, with a mild headache. He slept the rest of the day. His father did not witness any convulsive activity, his last witnessed convulsion was in 12/2022. He denies any body soreness, focal weakness, tongue bite, or incontinence. He was snacking the night prior but had not eaten anything heavy. He drinks a little on the weekends, no alcohol prior to the episode. He denies any gaps in time, staring episodes in wakefulness, olfactory/gustatory hallucinations, myoclonic jerks. Last week he got sick after eating too much grease, he woke up with a headache. He is on Topiramate 100mg  BID and feels mood is better on this compared to other ASMs tried. He has difficulty initiating sleep, an unrecalled medication prescribed by his PCP which did not help. He attributes sleep difficulty to not doing anything during the day due to driving restrictions.    History on Initial Assessment 03/24/2022: This is a 28 year old right-handed man presenting for evaluation of new onset seizures. His mother is present to provide additional information. He was in his usual state of health until 02/04/22. He recalls sleeping beside his 29 year old, woke up an hour later feeling fine and going back to sleep. He then woke up and  saw the girl's mother with blood around the room. The child had called her mother, he apparently got up from bed, went to the bathroom, then as he came out he fell and had a seizure. He hit the left side of his head and then went back to bed but has no recollection of this. He had bit his tongue, urinary incontinence, and required staples for the head injury. He was brought to the ER where bloodwork showed a WBC of 12, EtOH 10. Head CT no acute changes, venous sinuses were noted to be mildly hyperdense, nonspecific but can be seen in the setting of dehydration. He then had another seizure on 03/15/22. He recalls feeling tired the day prior. He was visiting a friend who told him that something was off when he got there, he went to sleep then woke up to EMS around him. He was brought to [facility], per ER notes, girlfriend woke up to patient having a seizure in bed, he bit his tongue and had nausea/vomiting. Head CT noted a vague small focus of hypoattenuation in the right frontal lobe that is favored to be artifactual. Bloodwork normal. He was discharged home on Levetiracetam 750mg  BID.He reports missing 2 doses the day before Christmas then having another nocturnal seizure on 03/22/22. He again bit his tongue. He has some drowsiness with the Levetiracetam. He has occasional body twitches in his head, neck, and back that he attributes to bulging discs. His legs would just jump uncontrollable sometimes, twitching and trembling.  After a long day, his head may twitch uncontrollably. No associated confusion. This started around are 22 or 23. He has occasional episodes of deja vu. No staring/unresponsive episodes, focal numbness/tingling/weakness, olfactory/gustatory hallucinations. He used to have migraines but denies any more headaches. No dizziness, diplopia, dysarthria/dysphagia, bowel/bladder dysfunction. He usually gets 7-9 hours of sleep. He drinks 2-3 liquor beverages daily. He smokes marijuana. He denies any  alcohol prior to the first seizure but recalls having a drink the night before the second and third seizures. He lives with his mother. Mood is fine.   Epilepsy Risk Factors:  His mother had one seizure and takes Topiramate. HIs father had 2 alcohol-related seizures. He had a normal birth and early development.  There is no history of febrile convulsions, CNS infections such as meningitis/encephalitis, significant traumatic brain injury, neurosurgical procedures, or family history of seizures.  Prior ASMs: Levetiracetam (mood changes), Zonisamide (waves of emotions he breaks down and cries everyday)   PAST MEDICAL HISTORY: Past Medical History:  Diagnosis Date  . Seizures (HCC) 02-05-22    MEDICATIONS: Current Outpatient Medications on File Prior to Visit  Medication Sig Dispense Refill  . clindamycin (CLINDAGEL) 1 % gel Apply topically 2 (two) times daily. 30 g 4  . diazePAM, 15 MG Dose, (VALTOCO 15 MG DOSE) 2 x 7.5 MG/0.1ML LQPK Administer one spray in one nostril, other spray in other nostril (one dose) as needed for seizure. May use another dose 4 hours after if needed. 5 each 5  . ondansetron (ZOFRAN-ODT) 4 MG disintegrating tablet Take 4 mg by mouth every 8 (eight) hours as needed.    . topiramate (TOPAMAX) 100 MG tablet Take 1 tablet (100 mg total) by mouth 2 (two) times daily. 60 tablet 11   No current facility-administered medications on file prior to visit.    ALLERGIES: No Known Allergies  FAMILY HISTORY: Family History  Problem Relation Age of Onset  . Seizures Mother        one only in HS  . Cancer Mother 45       breast  . Hypertension Mother   . Hypertension Father   . Alcohol abuse Father   . Seizures Father        etoh withdrawal  . CAD Other        50-60's. maternal side    SOCIAL HISTORY: Social History   Socioeconomic History  . Marital status: Single    Spouse name: Not on file  . Number of children: 0  . Years of education: Not on file  .  Highest education level: Not on file  Occupational History  . Not on file  Tobacco Use  . Smoking status: Every Day    Types: Cigars  . Smokeless tobacco: Never  Vaping Use  . Vaping status: Every Day  Substance and Sexual Activity  . Alcohol use: Yes    Alcohol/week: 7.0 standard drinks of alcohol    Types: 7 Shots of liquor per week    Comment: 2-3 on weekends  . Drug use: Yes    Types: Marijuana    Comment: Daily  . Sexual activity: Yes    Birth control/protection: None  Other Topics Concern  . Not on file  Social History Narrative   Unem.  1 step dau.   Occ hosts parties on weekends.   Are you right handed or left handed? Right    Are you currently employed ? No    What is your current occupation?  Do you live at home alone? no   Who lives with you? With family    What type of home do you live in: 1 story or 2 story?  15 steps        Social Drivers of Corporate investment banker Strain: Not on file  Food Insecurity: Not on file  Transportation Needs: Not on file  Physical Activity: Not on file  Stress: Not on file  Social Connections: Not on file  Intimate Partner Violence: Not on file     PHYSICAL EXAM: Vitals:   06/16/23 0754  BP: (!) 106/42  Pulse: 75  SpO2: 99%   General: No acute distress Head:  Normocephalic/atraumatic Skin/Extremities: No rash, no edema Neurological Exam: alert and awake. No aphasia or dysarthria. Fund of knowledge is appropriate.  Attention and concentration are normal.   Cranial nerves: Pupils equal, round. Extraocular movements intact with no nystagmus. Visual fields full.  No facial asymmetry.  Motor: Bulk and tone normal, muscle strength 5/5 throughout with no pronator drift.   Finger to nose testing intact.  Gait narrow-based and steady, able to tandem walk adequately.  Romberg negative.   IMPRESSION: This is a 28 yo RH man with focal to bilateral tonic-clonic epilepsy, etiology unknown. Brain MRI and EEG normal. On review of  prior seizures, seizures have been nocturnal, last witnessed nocturnal convulsion was 12/2022. He had an episode in sleep on 05/23/22 after being sleep deprived and not eating well where he was sweating and not responding. Concern was raised for hypoglycemia, however glucose level was not checked. Seizure is still a possibility. We discussed increasing evening dose of Topiramate, take 100mg  in AM, 150mg  in PM. We discussed that   He denies any seizures since October 2024, continue Topiramate 100mg  BID. We again discussed avoidance of seizure triggers, including missing medication, alcohol, sleep deprivation. He is aware of [place] driving laws to stop driving after a seizure until 6 months seizure-free. Follow-up in 6 months, call for any changes.     Thank you for allowing me to participate in *** care.  Please do not hesitate to call for any questions or concerns.  The duration of this appointment visit was *** minutes of face-to-face time with the patient.  Greater than 50% of this time was spent in counseling, explanation of diagnosis, planning of further management, and coordination of care.   Patrcia Dolly, M.D.   CC: ***

## 2023-06-16 NOTE — Patient Instructions (Signed)
 Good to see you.  Increase the Topiramate 100mg : take 1 tablet in AM, 1 and 1/2 tablets in PM  2. Continue to work on keep blood sugar levels stable, eat small frequent meals  3. Follow-up as scheduled in July, call for any changes   Seizure Precautions: 1. If medication has been prescribed for you to prevent seizures, take it exactly as directed.  Do not stop taking the medicine without talking to your doctor first, even if you have not had a seizure in a long time.   2. Avoid activities in which a seizure would cause danger to yourself or to others.  Don't operate dangerous machinery, swim alone, or climb in high or dangerous places, such as on ladders, roofs, or girders.  Do not drive unless your doctor says you may.  3. If you have any warning that you may have a seizure, lay down in a safe place where you can't hurt yourself.    4.  No driving for 6 months from last seizure, as per [facility].   Please refer to the following link on the [employer]ile[employer] of [employer]'s website for more information: http://www.epilepsyfoundation.org/answerplace/Social/driving/drivingu.cfm   5.  Maintain good sleep hygiene. Avoid alcohol.  6.  Contact your doctor if you have any problems that may be related to the medicine you are taking.  7.  Call 911 and bring the patient back to the ED if:        A.  The seizure lasts longer than 5 minutes.       B.  The patient doesn't awaken shortly after the seizure  C.  The patient has new problems such as difficulty seeing, speaking or moving  D.  The patient was injured during the seizure  E.  The patient has a temperature over 102 F (39C)  F.  The patient vomited and now is having trouble breathing

## 2023-06-16 NOTE — Progress Notes (Signed)
 NEUROLOGY FOLLOW UP OFFICE NOTE  John King [id number] Jul 31, 1995  HISTORY OF PRESENT ILLNESS: I had the pleasure of seeing John King in follow-up in the neurology clinic on 06/16/2023.  He is alone in the office today. The patient was last seen 2 months ago for seizures and presents for an earlier visit due to recent episode that occurred 05/24/23. He reports that he did not have any sleep the night prior, he went to sleep in the morning and his father heard a noise in his room at noon. He was lying in bed wrapped up in a blanket, sweating, eyes open but unresponsive. His father administered Valtoco nasal spray and he woke up after. He recalls getting up at 3pm with nausea/vomiting of saliva a few times, with a mild headache. He slept the rest of the day. His father did not witness any convulsive activity, his last witnessed convulsion was in 12/2022. He denies any body soreness, focal weakness, tongue bite, or incontinence. He was snacking the night prior but had not eaten anything heavy. He drinks a little on the weekends, no alcohol prior to the episode. He denies any gaps in time, staring episodes in wakefulness, olfactory/gustatory hallucinations, myoclonic jerks. Last week he got sick after eating too much grease, he woke up with a headache. He is on Topiramate 100mg  BID and feels mood is better on this compared to other ASMs tried. He has difficulty initiating sleep, an unrecalled medication prescribed by his PCP which did not help. He attributes sleep difficulty to not doing anything during the day due to driving restrictions.    History on Initial Assessment 03/24/2022: This is a 28 year old right-handed man presenting for evaluation of new onset seizures. His mother is present to provide additional information. He was in his usual state of health until 02/04/22. He recalls sleeping beside his 29 year old, woke up an hour later feeling fine and going back to sleep. He then woke up and  saw the girl's mother with blood around the room. The child had called her mother, he apparently got up from bed, went to the bathroom, then as he came out he fell and had a seizure. He hit the left side of his head and then went back to bed but has no recollection of this. He had bit his tongue, urinary incontinence, and required staples for the head injury. He was brought to the ER where bloodwork showed a WBC of 12, EtOH 10. Head CT no acute changes, venous sinuses were noted to be mildly hyperdense, nonspecific but can be seen in the setting of dehydration. He then had another seizure on 03/15/22. He recalls feeling tired the day prior. He was visiting a friend who told him that something was off when he got there, he went to sleep then woke up to EMS around him. He was brought to [facility], per ER notes, girlfriend woke up to patient having a seizure in bed, he bit his tongue and had nausea/vomiting. Head CT noted a vague small focus of hypoattenuation in the right frontal lobe that is favored to be artifactual. Bloodwork normal. He was discharged home on Levetiracetam 750mg  BID.He reports missing 2 doses the day before Christmas then having another nocturnal seizure on 03/22/22. He again bit his tongue. He has some drowsiness with the Levetiracetam. He has occasional body twitches in his head, neck, and back that he attributes to bulging discs. His legs would just jump uncontrollable sometimes, twitching and trembling.  After a long day, his head may twitch uncontrollably. No associated confusion. This started around are 22 or 23. He has occasional episodes of deja vu. No staring/unresponsive episodes, focal numbness/tingling/weakness, olfactory/gustatory hallucinations. He used to have migraines but denies any more headaches. No dizziness, diplopia, dysarthria/dysphagia, bowel/bladder dysfunction. He usually gets 7-9 hours of sleep. He drinks 2-3 liquor beverages daily. He smokes marijuana. He denies any  alcohol prior to the first seizure but recalls having a drink the night before the second and third seizures. He lives with his mother. Mood is fine.   Epilepsy Risk Factors:  His mother had one seizure and takes Topiramate. HIs father had 2 alcohol-related seizures. He had a normal birth and early development.  There is no history of febrile convulsions, CNS infections such as meningitis/encephalitis, significant traumatic brain injury, neurosurgical procedures, or family history of seizures.  Prior ASMs: Levetiracetam (mood changes), Zonisamide (waves of emotions he breaks down and cries everyday)   PAST MEDICAL HISTORY: Past Medical History:  Diagnosis Date   Seizures (HCC) 02-05-22    MEDICATIONS: Current Outpatient Medications on File Prior to Visit  Medication Sig Dispense Refill   clindamycin (CLINDAGEL) 1 % gel Apply topically 2 (two) times daily. 30 g 4   diazePAM, 15 MG Dose, (VALTOCO 15 MG DOSE) 2 x 7.5 MG/0.1ML LQPK Administer one spray in one nostril, other spray in other nostril (one dose) as needed for seizure. May use another dose 4 hours after if needed. 5 each 5   ondansetron (ZOFRAN-ODT) 4 MG disintegrating tablet Take 4 mg by mouth every 8 (eight) hours as needed.     topiramate (TOPAMAX) 100 MG tablet Take 1 tablet (100 mg total) by mouth 2 (two) times daily. 60 tablet 11   No current facility-administered medications on file prior to visit.    ALLERGIES: No Known Allergies  FAMILY HISTORY: Family History  Problem Relation Age of Onset   Seizures Mother        one only in HS   Cancer Mother 76       breast   Hypertension Mother    Hypertension Father    Alcohol abuse Father    Seizures Father        etoh withdrawal   CAD Other        50-60's. maternal side    SOCIAL HISTORY: Social History   Socioeconomic History   Marital status: Single    Spouse name: Not on file   Number of children: 0   Years of education: Not on file   Highest education  level: Not on file  Occupational History   Not on file  Tobacco Use   Smoking status: Every Day    Types: Cigars   Smokeless tobacco: Never  Vaping Use   Vaping status: Every Day  Substance and Sexual Activity   Alcohol use: Yes    Alcohol/week: 7.0 standard drinks of alcohol    Types: 7 Shots of liquor per week    Comment: 2-3 on weekends   Drug use: Yes    Types: Marijuana    Comment: Daily   Sexual activity: Yes    Birth control/protection: None  Other Topics Concern   Not on file  Social History Narrative   Unem.  1 step dau.   Occ hosts parties on weekends.   Are you right handed or left handed? Right    Are you currently employed ? No    What is your current occupation?  Do you live at home alone? no   Who lives with you? With family    What type of home do you live in: 1 story or 2 story?  15 steps        Social Drivers of Corporate investment banker Strain: Not on file  Food Insecurity: Not on file  Transportation Needs: Not on file  Physical Activity: Not on file  Stress: Not on file  Social Connections: Not on file  Intimate Partner Violence: Not on file     PHYSICAL EXAM: Vitals:   06/16/23 0754  BP: (!) 106/42  Pulse: 75  SpO2: 99%   General: No acute distress Head:  Normocephalic/atraumatic Skin/Extremities: No rash, no edema Neurological Exam: alert and awake. No aphasia or dysarthria. Fund of knowledge is appropriate.  Attention and concentration are normal.   Cranial nerves: Pupils equal, round. Extraocular movements intact with no nystagmus. Visual fields full.  No facial asymmetry.  Motor: Bulk and tone normal, muscle strength 5/5 throughout with no pronator drift.   Finger to nose testing intact.  Gait narrow-based and steady, able to tandem walk adequately.  Romberg negative.   IMPRESSION: This is a 28 yo RH man with focal to bilateral tonic-clonic epilepsy, etiology unknown. Brain MRI and EEG normal. On review of prior seizures, seizures  have been nocturnal, last witnessed nocturnal convulsion was 12/2022. He had an episode in sleep on 05/23/22 after being sleep deprived and not eating well where he was sweating and not responding. Concern was raised for hypoglycemia, however glucose level was not checked. Seizure is still a possibility. We discussed increasing evening dose of Topiramate, take 100mg  in AM, 150mg  in PM. We discussed that since his seizures have been nocturnal, he can resume driving in April, monitoring for any changes in symptoms. Continue working on keeping blood sugar levels stable. Follow-up as scheduled in July 2025, call for any changes.    Thank you for allowing me to participate in his care.  Please do not hesitate to call for any questions or concerns.    Patrcia Dolly, M.D.   CC: Dr. Ruthine Dose

## 2023-06-17 ENCOUNTER — Encounter (HOSPITAL_COMMUNITY): Payer: Self-pay | Admitting: Licensed Clinical Social Worker

## 2023-06-17 ENCOUNTER — Ambulatory Visit (HOSPITAL_COMMUNITY): Payer: PRIVATE HEALTH INSURANCE | Admitting: Licensed Clinical Social Worker

## 2023-06-17 DIAGNOSIS — F329 Major depressive disorder, single episode, unspecified: Secondary | ICD-10-CM | POA: Diagnosis not present

## 2023-06-17 DIAGNOSIS — F411 Generalized anxiety disorder: Secondary | ICD-10-CM

## 2023-06-17 DIAGNOSIS — F331 Major depressive disorder, recurrent, moderate: Secondary | ICD-10-CM

## 2023-06-17 NOTE — Progress Notes (Signed)
 Comprehensive Clinical Assessment (CCA) Note  06/17/2023 John King [id number]  Chief Complaint:  Chief Complaint  Patient presents with   Anxiety   Depression   Visit Diagnosis: MDD and GAD     Virtual Visit via Video Note  I connected with John King on 06/17/23 at 10:00 AM EDT by a video enabled telemedicine application and verified that I am speaking with the correct person using two identifiers.  Location: Patient: [facility]  Provider: Providers Home    I discussed the limitations of evaluation and management by telemedicine and the availability of in person appointments. The patient expressed understanding and agreed to proceed.  Client is a 28 year old male. Client is referred by  Psych medication provider at [facility] for depression and anxiety.   Client states mental health symptoms as evidenced by:   Depression Change in energy/activity; Fatigue; Hopelessness; Weight gain/loss; Tearfulness; Sleep (too much or little); Increase/decrease in appetite; Irritability; Worthlessness Change in energy/activity; Fatigue; Hopelessness; Weight gain/loss; Tearfulness; Sleep (too much or little); Increase/decrease in appetite; Irritability; Worthlessness  Duration of Depressive Symptoms N/A N/A  Anxiety Sleep; Tension; Restlessness; Irritability Sleep; Tension; Restlessness; Irritability  Psychosis None None  Trauma None None  Obsessions None None  Compulsions None None  Inattention None None  Hyperactivity/Impulsivity None None  Oppositional/Defiant Behaviors None None  Emotional Irregularity Chronic feelings of emptiness Chronic feelings of emptiness     Client was screened for the following SDOH: Smoking, financials, food, exercise, stress\tension, social interaction, PHQ-9, and housing  Assessment Information that integrates subjective and objective details with a therapist's professional interpretation:    John King comes in today alert and  oriented x 5.  He was pleasant, cooperative, maintained good eye contact.  Patient presented with anxious mood\affect.  John King engaged well in therapy session and was dressed casually.  Patient reports history for psychiatric diagnosis as anxiety and depression.  He endorses symptoms for worthlessness, hopelessness, tension, worry, insomnia, and anhedonia.  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  John King states today that in 2023 he had his first seizure.  He reports that his girlfriend at the time daughter found him on the floor bleeding.  He states that he was lucky that she called her mom to report the incident and her mom called 911.  John King feels indebted to his ex-girlfriend's daughter.  John King states although the relationship is over with him and his ex girlfriend he still maintains a relationship with her daughter.  Patient reports an overall increase of depression since having a seizure.  He states that this is the primary reason that he has not found a job as he was a Civil Service fast streamer for [employer].  John King states that he was fired from his job due to a suspension on his license for speeding ticket originally.  He was in the process of getting hired back but due to his seizure did not have driving privileges.  John King states that it has been a struggle to find a job but he does have an interview next week at a [employer].  Other stress patient financials as he has been unemployed for 2 years.  He reports that he is living with his parents who he overall has a good relationship with.  Patient states that if it was not for them he would be struggling financially.  John King reports that he has a sister who he has a below-average relationship with.  John King attributes this to  the fact that they are from different generations.  As evidenced by his sister coming in and out of the house whenever she wants to.  Patient states that he would not be held to the same  standard if he did the same things that she did.  John King states he would like to have better coping skills to decrease his depression and anxiety.  He also states that he would like to increase his self-worth by engaging in therapy services.  LCSW in today's session administered a PHQ-9.  LCSW administered a GAD-7.  Patient scored in both moderate categories for PHQ-9 and GAD-7.  John King does report passive suicidal ideations with no plan or intent.  LCSW provided patient resources to behavioral health urgent care at [facility] and also provided him the suicide prevention hotline 988.    Patient does report marijuana use daily.  He reports utilizing marijuana in the quantities of 1/8 weekly.  He states that this is not an all day type thing but at night to help him sleep.  LCSW spoke with patient about services at alternative office through [facility] health in [place] [facility] and patient was agreeable to referral  Client states use of the following substances: Marijuana use 1/8 weekly   Therapist addressed (substance use) concern, although client meets criteria, he/ she reports they do not wish to pursue tx at this time although therapist feels they would benefit from SA counseling.    Client was in agreement with treatment recommendations.      I discussed the assessment and treatment plan with the patient. The patient was provided an opportunity to ask questions and all were answered. The patient agreed with the plan and demonstrated an understanding of the instructions.   The patient was advised to call back or seek an in-person evaluation if the symptoms worsen or if the condition fails to improve as anticipated.  I provided 45 minutes of non-face-to-face time during this encounter.   Weber Cooks, LCSW   CCA Screening, Triage and Referral (STR)  Patient Reported Information How did you hear about [place]? Family/Friend  Referral name: Psych  medications provider   Whom do you see for routine medical problems? Primary Care  Practice/Facility Name: [facility]   What Is the Reason for Your Visit/Call Today? Pt reports since starting to have medical problem such as seizure in 2023 he has been steadily increasing his depression. He reports feeling stuck at home. Antwon reports he has not been able to find stable employment which has resulted in him relying on his parents. Jermari reports tearfulness several times per week due to feelings of hopelessness and worthlessness.  How Long Has This Been Causing You Problems? > than 6 months  What Do You Feel Would Help You the Most Today? Treatment for Depression or other mood problem; Stress Management; Social Support   Have You Recently Been in Any Inpatient Treatment (Hospital/Detox/Crisis Center/28-Day Program)? No    Have You Ever Received Services From [facility] Before? Yes  Who Do You See at [facility]? [place] [facility]   Have You Recently Had Any Thoughts About Hurting Yourself? Yes  Are You Planning to Commit Suicide/Harm Yourself At This time? No   Have you Recently Had Thoughts About Hurting Someone Karolee Ohs? No  Have You Used Any Alcohol or Drugs in the Past 24 Hours? Yes  What Did You Use and How Much? Few hits of marijuana   Do You Currently  Have a Therapist/Psychiatrist? Yes  Name of Therapist/Psychiatrist: [facility] for medication mgnt   Have You Been Recently Discharged From Any Office Practice or Programs? No    CCA Screening Triage Referral Assessment Type of Contact: Face-to-Face  Is CPS involved or ever been involved? Never  Is APS involved or ever been involved? Never  Patient Determined To Be At Risk for Harm To Self or Others Based on Review of Patient Reported Information or Presenting Complaint? Yes, for Self-Harm  Method: No Plan  Availability of Means: No access or NA  Intent: Vague intent or  NA  Notification Required: No need or identified person  Are There Guns or Other Weapons in Your Home? Yes  Types of Guns/Weapons: Gun   Who Could Verify You Are Able To Have These Secured: Father  gun and father can confirmed that is locked up  Do You Have any Outstanding Charges, Pending Court Dates, Parole/Probation? none reported  Contacted To Inform of Risk of Harm To Self or Others: No data recorded  Location of Assessment: [facility]   Does Patient Present under Involuntary Commitment? No   [place] of Residence: [place]   Patient Currently Receiving the Following Services: No data recorded  Determination of Need: Routine (7 days)   Options For Referral: Outpatient Therapy   CCA Biopsychosocial Intake/Chief Complaint:  Pt reports seizures started in 2023. He had one while he was taking care of his ex girfriends daughter. Daughter called her mom and mom called 911. Pt reports he had hit his head and was bleeding. He states he could have bled out if she didnt call her mother. He reports he is no longer with the mother but still has a relationship with the daughter. Demosthenes reports they broke up due to infadality on his part. Pt states increased depression for syptoms listed below  Current Symptoms/Problems: worthlenss, hopelessness, fatigue, islation, anhedonia  Strengths: willing to engage in treatment  Preferences: therapy  Abilities: none reported  Type of Services Patient Feels are Needed: therapy  Mental Health Symptoms Depression:  Change in energy/activity; Fatigue; Hopelessness; Weight gain/loss; Tearfulness; Sleep (too much or little); Increase/decrease in appetite; Irritability; Worthlessness   Duration of Depressive symptoms: N/A   Mania:  No data recorded  Anxiety:   Sleep; Tension; Restlessness; Irritability   Psychosis:  None   Duration of Psychotic symptoms: No data recorded  Trauma:  None   Obsessions:  None   Compulsions:   None   Inattention:  None   Hyperactivity/Impulsivity:  None   Oppositional/Defiant Behaviors:  None   Emotional Irregularity:  Chronic feelings of emptiness   Other Mood/Personality Symptoms:  No data recorded   Mental Status Exam Appearance and self-care  Stature:  Average   Weight:  Average weight   Clothing:  Casual   Grooming:  Normal   Cosmetic use:  None   Posture/gait:  Normal   Motor activity:  Not Remarkable   Sensorium  Attention:  Normal   Concentration:  Normal   Orientation:  X5   Recall/memory:  Normal   Affect and Mood  Affect:  Anxious; Depressed   Mood:  Anxious; Depressed   Relating  Eye contact:  Normal   Facial expression:  Anxious; Depressed   Attitude toward examiner:  Cooperative   Thought and Language  Speech flow: Clear and Coherent   Thought content:  Appropriate to Mood and Circumstances   Preoccupation:  None   Hallucinations:  None   Organization:  No data recorded  [employer] of Knowledge:  Good; Fair   Intelligence:  Average   Abstraction:  Normal   Judgement:  Good   Reality Testing:  Realistic   Insight:  Good   Decision Making:  Normal   Social Functioning  Social Maturity:  Isolates   Social Judgement:  Normal   Stress  Stressors:  Illness; Financial; Work   Coping Ability:  Deficient supports; Exhausted; Overwhelmed   Skill Deficits:  Self-care; Responsibility; Interpersonal   Supports:  Family     Religion: Religion/Spirituality Are You A Religious Person?: Yes What is Your Religious Affiliation?: Chiropodist: Leisure / Recreation Do You Have Hobbies?: Yes Leisure and Hobbies: playing sports, making music  Exercise/Diet: Exercise/Diet Do You Exercise?: Yes What Type of Exercise Do You Do?: Other (Comment) (basketball with friends) How Many Times a Week Do You Exercise?: 1-3 times a week Have You Gained or Lost A Significant Amount of Weight  in the Past Six Months?: No Do You Follow a Special Diet?: No Do You Have Any Trouble Sleeping?: Yes Explanation of Sleeping Difficulties: falling and staying asleep   CCA Employment/Education Employment/Work Situation: Employment / Work Situation Employment Situation: Unemployed Patient's Job has Been Impacted by Current Illness: Yes Describe how Patient's Job has Been Impacted: Trouble finding work after seizure. Has Patient ever Been in the Military?: No  Education: Education Is Patient Currently Attending School?: No Last Grade Completed: 12 Did You Graduate From [employer]?: No Did You Product manager?: No Did You Attend Graduate School?: No Did You Have An Individualized Education Program (IIEP): No Did You Have Any Difficulty At School?: No Patient's Education Has Been Impacted by Current Illness: No   CCA Family/Childhood History Family and Relationship History: Family history Marital status: Single Are you sexually active?: Yes What is your sexual orientation?: hetrsoexual Has your sexual activity been affected by drugs, alcohol, medication, or emotional stress?: none reported Does patient have children?: No  Childhood History:  Childhood History By whom was/is the patient raised?: Both parents Description of patient's relationship with caregiver when they were a child: Good with both parents. Dad worked mom raised him Patient's description of current relationship with people who raised him/her: Lives with both parents good relationship Does patient have siblings?: Yes Number of Siblings: 1 Description of patient's current relationship with siblings: Sister: Touchy Did patient suffer any verbal/emotional/physical/sexual abuse as a child?: No Did patient suffer from severe childhood neglect?: No Has patient ever been sexually abused/assaulted/raped as an adolescent or adult?: No Was the patient ever a victim of a crime or a disaster?: No Witnessed domestic  violence?: No Has patient been affected by domestic violence as an adult?: No  Child/Adolescent Assessment:     CCA Substance Use Alcohol/Drug Use: Alcohol / Drug Use History of alcohol / drug use?: Yes Negative Consequences of Use: Financial Substance #1 Name of Substance 1: Marijuana 1 - Amount (size/oz): 1/8 1 - Frequency: daily 1 - Last Use / Amount: last night 1 - Method of Aquiring: dealer 1- Route of Use: inhale   ASAM's:  Six Dimensions of Multidimensional Assessment  Dimension 1:  Acute Intoxication and/or Withdrawal Potential:      Dimension 2:  Biomedical Conditions and Complications:      Dimension 3:  Emotional, Behavioral, or Cognitive Conditions and Complications:     Dimension 4:  Readiness to Change:     Dimension 5:  Relapse, Continued use, or Continued Problem Potential:  Dimension 6:  Recovery/Living Environment:     ASAM Severity Score:    ASAM Recommended Level of Treatment: ASAM Recommended Level of Treatment: Level I Outpatient Treatment   Substance use Disorder (SUD)    Recommendations for Services/Supports/Treatments: Recommendations for Services/Supports/Treatments Recommendations For Services/Supports/Treatments: Individual Therapy  DSM5 Diagnoses: Patient Active Problem List   Diagnosis Date Noted   Adjustment insomnia 03/01/2023   Moderate episode of recurrent major depressive disorder (HCC) 12/17/2022   Generalized anxiety disorder 12/17/2022   Folliculitis barbae 08/04/2022   Seizure disorder (HCC) 05/03/2022   History of cold sores 05/03/2022   Cough 04/07/2016   Collaboration of Care: Other Referral to [facility] Psych office due to private insurance and frequency of therapy needed for successful treatment   Patient/Guardian was advised Release of Information must be obtained prior to any record release in order to collaborate their care with an outside provider. Patient/Guardian was advised if they have not already done  so to contact the registration department to sign all necessary forms in order for [place] to release information regarding their care.   Consent: Patient/Guardian gives verbal consent for treatment and assignment of benefits for services provided during this visit. Patient/Guardian expressed understanding and agreed to proceed.   Weber Cooks, LCSW

## 2023-06-27 ENCOUNTER — Encounter: Payer: Self-pay | Admitting: Neurology

## 2023-06-27 ENCOUNTER — Other Ambulatory Visit: Payer: Self-pay | Admitting: Neurology

## 2023-07-20 ENCOUNTER — Encounter (HOSPITAL_COMMUNITY): Payer: Self-pay

## 2023-07-20 ENCOUNTER — Ambulatory Visit (INDEPENDENT_AMBULATORY_CARE_PROVIDER_SITE_OTHER): Admitting: Licensed Clinical Social Worker

## 2023-07-20 DIAGNOSIS — F331 Major depressive disorder, recurrent, moderate: Secondary | ICD-10-CM

## 2023-07-20 DIAGNOSIS — F411 Generalized anxiety disorder: Secondary | ICD-10-CM

## 2023-07-20 NOTE — Progress Notes (Signed)
 THERAPIST PROGRESS NOTE   Session Date: 07/20/2023  Session Time: 1113 - 1205 Virtual Visit via Video Note  I connected with John King on 07/20/23 at 11:00 AM EDT by a video enabled telemedicine application and verified that I am speaking with the correct person using two identifiers.  Location: Patient: Home Provider: [facility] [place] Office   I discussed the limitations of evaluation and management by telemedicine and the availability of in person appointments. The patient expressed understanding and agreed to proceed.   I discussed the assessment and treatment plan with the patient. The patient was provided an opportunity to ask questions and all were answered. The patient agreed with the plan and demonstrated an understanding of the instructions.   The patient was advised to call back or seek an in-person evaluation if the symptoms worsen or if the condition fails to improve as anticipated.  I provided 52 minutes of non-face-to-face time during this encounter.   Patsi Boots, LCSW   Participation Level: Active  Behavioral Response: CasualAlertAnxious, Depressed, and Euthymic  Type of Therapy: Individual Therapy  Treatment Goals addressed:  Anxiety    STG: Report a decrease in anxiety symptoms as evidenced by an overall reduction in anxiety score by a minimum of 25% on the Generalized Anxiety Disorder Scale (GAD-7)     STG: Crue will reduce frequency of avoidant behaviors by 50% as evidenced by self-report in therapy sessions     LTG: "Be able to be comfortable with myself"  (Initial)        OP Depression    STG: Talon will reduce frequency of avoidant behaviors by 50% as evidenced by self-report in therapy sessions     LTG: Reduce frequency, intensity, and duration of depression symptoms so that daily functioning is improved     LTG: Increase coping skills to manage depression and improve ability to perform daily activities      STG: Reduce overall  depression score by a minimum of 25% on the Patient Health Questionnaire (PHQ-9)     LTG: "I just want to be able to go outside and smile and be happy again"       Progress Towards Goals: Initial  Interventions: CBT, Solution Focused, and Supportive  Summary: John King is a 28 y.o. male with past psych history of MDD and AGD, presenting for follow-up therapy session in efforts to improve management of presenting depressive and anxious symptoms.   Patient actively engaged in session, presenting in primarily pleasant moods with periods of anxious and depressive sxs exhibited throughout visit. Pt proved open and receptive to engaging with clinician in brief review of intake visit with alternate clinician, processing presenting problems and primary concerns leading pt to pursue tx and further reflecting on hx of experienced challenges and sxs. Further engaged in exploration of individualized tx goals specific to the management of presenting depressive and anxious sxs in efforts to develop tx plan. Explored future course of tx with pt and factors related to availability of scheduling. =  Patient responded well to interventions. Patient continues to meet criteria for MDD and GAD. Patient will continue to benefit from engagement in outpatient therapy due to being the least restrictive service to meet presenting needs.      07/20/2023   11:38 AM 06/17/2023   10:18 AM 04/26/2023    3:39 PM 03/01/2023    3:22 PM  GAD 7 : Generalized Anxiety Score  Nervous, Anxious, on Edge 2 2 2 2   Control/stop worrying 2 2  2 2  Worry too much - different things 2 3 2 2   Trouble relaxing 1 2 1 1   Restless 1 1 1 1   Easily annoyed or irritable 2 2 2 2   Afraid - awful might happen 2 1 2 2   Total GAD 7 Score 12 13 12 12   Anxiety Difficulty Very difficult Somewhat difficult Very difficult Very difficult      07/20/2023   11:40 AM 06/17/2023   10:13 AM 04/26/2023    3:37 PM 03/01/2023    3:22 PM 01/18/2023    4:26 PM   Depression screen PHQ 2/9  Decreased Interest 1 1 2 2 1   Down, Depressed, Hopeless 2 1 2 2 2   PHQ - 2 Score 3 2 4 4 3   Altered sleeping 0 2 2 3 2   Tired, decreased energy 0 2 2 2 1   Change in appetite 0 2 1 1 1   Feeling bad or failure about yourself  2 2 3  0 2  Trouble concentrating 0 1 2 2 2   Moving slowly or fidgety/restless 0 1 1 1 2   Suicidal thoughts 0 1 2 1 2   PHQ-9 Score 5 13 17 14 15   Difficult doing work/chores Somewhat difficult Somewhat difficult Very difficult Somewhat difficult Very difficult   Suicidal/Homicidal: Nowithout intent/plan  Therapist Response: Clinician utilized CBT, solution focused, and supportive reflection interventions to address presenting challenges and management of presenting sxs.   Clinician actively greeted pt, engaging pt in introductory check-in, assessing presenting moods and affect. Actively engaged pt in re-assessing presenting depressive and anxious sxs experienced and/or observed over the past two weeks via PHQ-9 and GAD-7, further supporting pt in processing experienced sxs and challenges.  Utilized open-ended questions and efforts to elicit patient's thoughts and feelings in relation to initial intake assessment with alternate clinician and continued presence of symptoms and areas of concern.  Utilized Socratic questioning to encourage patient's critical thinking in relation to desired outcomes and identification of appropriate areas for work in development of individualized goals to finalize treatment plan.  Supported patient in processing what can be expected throughout future course of treatment and identification of appropriate scheduling.  Clinician reassessed severity of presenting sxs, and presence of any safety concerns. Clinician provided support and empathy to patient during session.  Plan: Return again in 1 weeks.  Diagnosis:  Encounter Diagnoses  Name Primary?   Moderate episode of recurrent major depressive disorder (HCC) Yes    Generalized anxiety disorder     Collaboration of Care: Psychiatrist AEB provider notes available in EHR.  Patient/Guardian was advised Release of Information must be obtained prior to any record release in order to collaborate their care with an outside provider. Patient/Guardian was advised if they have not already done so to contact the registration department to sign all necessary forms in order for us  to release information regarding their care.   Consent: Patient/Guardian gives verbal consent for treatment and assignment of benefits for services provided during this visit. Patient/Guardian expressed understanding and agreed to proceed.   Patsi Boots, MSW, LCSW 07/20/2023,  11:45 AM

## 2023-07-21 ENCOUNTER — Ambulatory Visit (HOSPITAL_COMMUNITY): Admitting: Licensed Clinical Social Worker

## 2023-07-25 ENCOUNTER — Ambulatory Visit (INDEPENDENT_AMBULATORY_CARE_PROVIDER_SITE_OTHER): Admitting: Licensed Clinical Social Worker

## 2023-07-25 DIAGNOSIS — F411 Generalized anxiety disorder: Secondary | ICD-10-CM | POA: Diagnosis not present

## 2023-07-25 DIAGNOSIS — F331 Major depressive disorder, recurrent, moderate: Secondary | ICD-10-CM | POA: Diagnosis not present

## 2023-07-25 NOTE — Progress Notes (Signed)
 THERAPIST PROGRESS NOTE   Session Date: 07/25/2023  Session Time: 0907 - 9147  Virtual Visit via Video Note  I connected with John King on 07/25/23 at  9:07 AM EDT by a video enabled telemedicine application and verified that I am speaking with the correct person using two identifiers.  Location: Patient: Home Provider: Home Office   I discussed the limitations of evaluation and management by telemedicine and the availability of in person appointments. The patient expressed understanding and agreed to proceed.   The patient was advised to call back or seek an in-person evaluation if the symptoms worsen or if the condition fails to improve as anticipated.  I provided 50 minutes of non-face-to-face time during this encounter.  Participation Level: Active  Behavioral Response: CasualAlertAnxious and Depressed  Type of Therapy: Individual Therapy  Treatment Goals addressed:  Anxiety    STG: Report a decrease in anxiety symptoms as evidenced by an overall reduction in anxiety score by a minimum of 25% on the Generalized Anxiety Disorder Scale (GAD-7)     STG: John King will reduce frequency of avoidant behaviors by 50% as evidenced by self-report in therapy sessions     LTG: "Be able to be comfortable with myself"       OP Depression    STG: John King will reduce frequency of avoidant behaviors by 50% as evidenced by self-report in therapy sessions     LTG: Reduce frequency, intensity, and duration of depression symptoms so that daily functioning is improved     LTG: Increase coping skills to manage depression and improve ability to perform daily activities      STG: Reduce overall depression score by a minimum of 25% on the Patient Health Questionnaire (PHQ-9)     LTG: "I just want to be able to go outside and smile and be happy again"       Progress Towards Goals: Not Progressing  Interventions: CBT, Solution Focused, and Supportive  Summary: John King is a 28  y.o. male with past psych history of MDD and GAD, presenting for follow-up therapy session in efforts to improve management of presenting depressive and anxious symptoms.   Patient actively engaged in session, presenting and variable moods, with periods of depressive and anxious nature in relation to experienced stressors and presenting challenges.  Patient actively engaged in reflection of recent events, noting of increased stress and challenges in relation to prior romantic relationship/involvement, extensively detailing history of relationship with individual, engaging in further exploration of variances and relationship over the past 2 years and comparison to current nature of relationship and alternate perspectives held by patient and romantic interest.  Actively engaged in processing thoughts and feelings in relation to status of relationship, and contributing factors having impacted relationship throughout history.  Explored patient's history with writing/journaling, processing potential benefits and supports from resuming technique to further support patient in processing thoughts and feelings.  Patient responded well to interventions. Patient continues to meet criteria for MDD and GAD. Patient will continue to benefit from engagement in outpatient therapy due to being the least restrictive service to meet presenting needs.      07/20/2023   11:38 AM 06/17/2023   10:18 AM 04/26/2023    3:39 PM 03/01/2023    3:22 PM  GAD 7 : Generalized Anxiety Score  Nervous, Anxious, on Edge 2 2 2 2   Control/stop worrying 2 2 2 2   Worry too much - different things 2 3 2 2   Trouble relaxing 1 2 1  1  Restless 1 1 1 1   Easily annoyed or irritable 2 2 2 2   Afraid - awful might happen 2 1 2 2   Total GAD 7 Score 12 13 12 12   Anxiety Difficulty Very difficult Somewhat difficult Very difficult Very difficult      07/20/2023   11:40 AM 06/17/2023   10:13 AM 04/26/2023    3:37 PM 03/01/2023    3:22 PM 01/18/2023     4:26 PM  Depression screen PHQ 2/9  Decreased Interest 1 1 2 2 1   Down, Depressed, Hopeless 2 1 2 2 2   PHQ - 2 Score 3 2 4 4 3   Altered sleeping 0 2 2 3 2   Tired, decreased energy 0 2 2 2 1   Change in appetite 0 2 1 1 1   Feeling bad or failure about yourself  2 2 3  0 2  Trouble concentrating 0 1 2 2 2   Moving slowly or fidgety/restless 0 1 1 1 2   Suicidal thoughts 0 1 2 1 2   PHQ-9 Score 5 13 17 14 15   Difficult doing work/chores Somewhat difficult Somewhat difficult Very difficult Somewhat difficult Very difficult   Suicidal/Homicidal: Nowithout intent/plan  Therapist Response: Clinician utilized CBT, solution focused, and supportive reflection interventions to address presenting challenges and management of presenting sxs.   Clinician actively greeted patient upon joining visit, engaging in introductory check-in, assessing presenting moods and affect, further engaging patient in reflection of recent events and factors contributing to presenting moods.  Actively listened to patient's recounts of recent events and stressors contributing to poor moods, utilizing open-ended questions to further prompt patient's exploration of history surrounding challenges.  Utilized Socratic questioning to further encourage critical processing of thoughts and feelings, as well as how these have varied throughout history of relationship, and perspectives related to feelings of other individual.  Encouraged patient to resume practice of writing/journaling to further support in noting and/or documenting thoughts, feelings, and emotions and efforts to improve abilities to reduce worries and stress, and processed factors at a later time.  Clinician reassessed severity of presenting sxs, and presence of any safety concerns. Clinician provided support and empathy to patient during session.  Plan: Return again in 1 weeks.  Diagnosis:  Encounter Diagnoses  Name Primary?   Moderate episode of recurrent major  depressive disorder (HCC) Yes   Generalized anxiety disorder     Collaboration of Care: Psychiatrist AEB provider notes available in EHR.  Patient/Guardian was advised Release of Information must be obtained prior to any record release in order to collaborate their care with an outside provider. Patient/Guardian was advised if they have not already done so to contact the registration department to sign all necessary forms in order for us  to release information regarding their care.   Consent: Patient/Guardian gives verbal consent for treatment and assignment of benefits for services provided during this visit. Patient/Guardian expressed understanding and agreed to proceed.   Patsi Boots, MSW, LCSW 07/25/2023,  9:10 AM

## 2023-08-01 ENCOUNTER — Ambulatory Visit (INDEPENDENT_AMBULATORY_CARE_PROVIDER_SITE_OTHER): Admitting: Licensed Clinical Social Worker

## 2023-08-01 DIAGNOSIS — F331 Major depressive disorder, recurrent, moderate: Secondary | ICD-10-CM

## 2023-08-01 DIAGNOSIS — F411 Generalized anxiety disorder: Secondary | ICD-10-CM | POA: Diagnosis not present

## 2023-08-01 NOTE — Progress Notes (Signed)
 THERAPIST PROGRESS NOTE   Session Date: 08/01/2023  Session Time: 1504 - 1602 Virtual Visit via Video Note  I connected with John King on 08/01/23 at  3:00 PM EDT by a video enabled telemedicine application and verified that I am speaking with the correct person using two identifiers.  Location: Patient: Home Provider: Home office   I discussed the limitations of evaluation and management by telemedicine and the availability of in person appointments. The patient expressed understanding and agreed to proceed.  I discussed the assessment and treatment plan with the patient. The patient was provided an opportunity to ask questions and all were answered. The patient agreed with the plan and demonstrated an understanding of the instructions.   The patient was advised to call back or seek an in-person evaluation if the symptoms worsen or if the condition fails to improve as anticipated.  I provided 57 minutes of non-face-to-face time during this encounter.  Participation Level: Active  Behavioral Response: CasualAlertAnxious and Depressed  Type of Therapy: Individual Therapy  Treatment Goals addressed:  Anxiety    STG: Report a decrease in anxiety symptoms as evidenced by an overall reduction in anxiety score by a minimum of 25% on the Generalized Anxiety Disorder Scale (GAD-7)     STG: Arno will reduce frequency of avoidant behaviors by 50% as evidenced by self-report in therapy sessions     LTG: "Be able to be comfortable with myself"       OP Depression    STG: Jael will reduce frequency of avoidant behaviors by 50% as evidenced by self-report in therapy sessions     LTG: Reduce frequency, intensity, and duration of depression symptoms so that daily functioning is improved     LTG: Increase coping skills to manage depression and improve ability to perform daily activities      STG: Reduce overall depression score by a minimum of 25% on the Patient Health  Questionnaire (PHQ-9)     LTG: "I just want to be able to go outside and smile and be happy again"       Progress Towards Goals: Progressing  Interventions: CBT, Solution Focused, and Supportive  Summary: John King is a 28 y.o. male with past psych history of MDD and GAD, presenting for follow-up therapy session in efforts to improve management of presenting depressive and anxious symptoms.   Patient actively engaged in session, presenting in overall pleasant moods and flat affect, actively engaging in introductory check-in, sharing of things to be going well, with increased activity/tasks with having started new job over recent weeks. Engaged in reassessing presenting depressive and anxious sxs via PHA-9 and GAD-7, noting of reduction in both depressive and anxious sxs observed by 80% or greater, processing of efforts pt has implemented to improve management of stressors, sharing of starting to let a lot of things go and realizing inabilities to control a lot of the things he previously wanted to control, especially factors related to close friends and family, and them not proving to support pt when previously needed. Pt detailed recent efforts focusing on self, feeling motivated and able to accomplish things. Still feeling experiencing rejection in regards to relationship and working to navigate with romantic interest. Actively engaged in exploring "What is Anxiety?" Handout, processing sxs, types of anxiety, frequent responses to anxiousness being avoidant behaviors, and various different approaches to support in management of anxiety.  Patient responded well to interventions. Patient continues to meet criteria for MDD and GAD. Patient will continue to benefit  from engagement in outpatient therapy due to being the least restrictive service to meet presenting needs.      08/01/2023    3:16 PM 07/20/2023   11:38 AM 06/17/2023   10:18 AM 04/26/2023    3:39 PM  GAD 7 : Generalized Anxiety Score   Nervous, Anxious, on Edge 0 2 2 2   Control/stop worrying 1 2 2 2   Worry too much - different things 0 2 3 2   Trouble relaxing 0 1 2 1   Restless 0 1 1 1   Easily annoyed or irritable 1 2 2 2   Afraid - awful might happen 0 2 1 2   Total GAD 7 Score 2 12 13 12   Anxiety Difficulty Somewhat difficult Very difficult Somewhat difficult Very difficult      08/01/2023    3:09 PM 07/20/2023   11:40 AM 06/17/2023   10:13 AM 04/26/2023    3:37 PM 03/01/2023    3:22 PM  Depression screen PHQ 2/9  Decreased Interest 0 1 1 2 2   Down, Depressed, Hopeless 0 2 1 2 2   PHQ - 2 Score 0 3 2 4 4   Altered sleeping 0 0 2 2 3   Tired, decreased energy 0 0 2 2 2   Change in appetite 0 0 2 1 1   Feeling bad or failure about yourself  1 2 2 3  0  Trouble concentrating 0 0 1 2 2   Moving slowly or fidgety/restless 0 0 1 1 1   Suicidal thoughts 0 0 1 2 1   PHQ-9 Score 1 5 13 17 14   Difficult doing work/chores Somewhat difficult Somewhat difficult Somewhat difficult Very difficult Somewhat difficult   Suicidal/Homicidal: Nowithout intent/plan  Therapist Response: Clinician utilized CBT, solution focused, and supportive reflection interventions to address presenting challenges and management of presenting sxs.  Clinician actively greeted patient upon joining visit, engaging in introductory check-in, assessing presenting moods and affect, eliciting pt's reflection of events and factors contributing to improvements in moods over recent weeks. Actively listened to pt's recounts of events, utilizing open ended questions to further explore presenting stressors and efforts at managing such. Utilized CBT and psycho-ed interventions to support pt in greater overview of anxiety and how this proves to present in various individuals, types of anxiousness, and means of managing.  Clinician reassessed severity of presenting sxs, and presence of any safety concerns. Clinician provided support and empathy to patient during  session.  Plan: Return again in 1 weeks.  Diagnosis:  Encounter Diagnoses  Name Primary?   Moderate episode of recurrent major depressive disorder (HCC) Yes   Generalized anxiety disorder     Collaboration of Care: Psychiatrist AEB provider notes available in EHR.  Patient/Guardian was advised Release of Information must be obtained prior to any record release in order to collaborate their care with an outside provider. Patient/Guardian was advised if they have not already done so to contact the registration department to sign all necessary forms in order for us  to release information regarding their care.   Consent: Patient/Guardian gives verbal consent for treatment and assignment of benefits for services provided during this visit. Patient/Guardian expressed understanding and agreed to proceed.   Patsi Boots, MSW, LCSW 08/01/2023,  3:42 PM

## 2023-08-08 ENCOUNTER — Ambulatory Visit (INDEPENDENT_AMBULATORY_CARE_PROVIDER_SITE_OTHER): Payer: PRIVATE HEALTH INSURANCE | Admitting: Licensed Clinical Social Worker

## 2023-08-08 DIAGNOSIS — F411 Generalized anxiety disorder: Secondary | ICD-10-CM

## 2023-08-08 DIAGNOSIS — F331 Major depressive disorder, recurrent, moderate: Secondary | ICD-10-CM

## 2023-08-08 NOTE — Progress Notes (Unsigned)
 THERAPIST PROGRESS NOTE   Session Date: 08/08/2023  Session Time: 1505 - 1554 Virtual Visit via Video Note  I connected with John King on 08/08/23 at  3:00 PM EDT by a video enabled telemedicine application and verified that I am speaking with the correct person using two identifiers.  Location: Patient: Home Provider: Home office   I discussed the limitations of evaluation and management by telemedicine and the availability of in person appointments. The patient expressed understanding and agreed to proceed.  I discussed the assessment and treatment plan with the patient. The patient was provided an opportunity to ask questions and all were answered. The patient agreed with the plan and demonstrated an understanding of the instructions.   The patient was advised to call back or seek an in-person evaluation if the symptoms worsen or if the condition fails to improve as anticipated.  I provided 48 minutes of non-face-to-face time during this encounter.  Participation Level: Active  Behavioral Response: CasualAlertAnxious and Depressed  Type of Therapy: Individual Therapy  Treatment Goals addressed:  Anxiety    STG: Report a decrease in anxiety symptoms as evidenced by an overall reduction in anxiety score by a minimum of 25% on the Generalized Anxiety Disorder Scale (GAD-7)     STG: John King will reduce frequency of avoidant behaviors by 50% as evidenced by self-report in therapy sessions     LTG: "Be able to be comfortable with myself"       OP Depression    STG: John King will reduce frequency of avoidant behaviors by 50% as evidenced by self-report in therapy sessions     LTG: Reduce frequency, intensity, and duration of depression symptoms so that daily functioning is improved     LTG: Increase coping skills to manage depression and improve ability to perform daily activities      STG: Reduce overall depression score by a minimum of 25% on the Patient Health  Questionnaire (PHQ-9)     LTG: "I just want to be able to go outside and smile and be happy again"       Progress Towards Goals: Progressing  Interventions: CBT, Solution Focused, and Supportive  Summary: John King is a 28 y.o. male with past psych history of MDD and GAD, presenting for follow-up therapy session in efforts to improve management of presenting depressive and anxious symptoms.   Patient actively engaged in session, presenting in overall pleasant moods and congruent affect, actively engaging in introductory check-in, sharing of things to be going okay, noticing challenges when people bring up the past and talk about past events such as things pt may have done and not having been aware of past behaviors. Pt acknowledged having been on a destructive path during time when drinking heavily and having frequent seizures, focusing solely on that moment in time, and having been at a point where pt was drinking more as a means to distract and/or suppress all of the things he could not control. Now finding self to try and find a way to move forward, finding self bothered by, and feeling of having missed an entire year mentally and emotionally and disconnected with romantic interest. ***  Patient responded well to interventions. Patient continues to meet criteria for MDD and GAD. Patient will continue to benefit from engagement in outpatient therapy due to being the least restrictive service to meet presenting needs.      08/01/2023    3:16 PM 07/20/2023   11:38 AM 06/17/2023   10:18 AM 04/26/2023  3:39 PM  GAD 7 : Generalized Anxiety Score  Nervous, Anxious, on Edge 0 2 2 2   Control/stop worrying 1 2 2 2   Worry too much - different things 0 2 3 2   Trouble relaxing 0 1 2 1   Restless 0 1 1 1   Easily annoyed or irritable 1 2 2 2   Afraid - awful might happen 0 2 1 2   Total GAD 7 Score 2 12 13 12   Anxiety Difficulty Somewhat difficult Very difficult Somewhat difficult Very difficult       08/01/2023    3:09 PM 07/20/2023   11:40 AM 06/17/2023   10:13 AM 04/26/2023    3:37 PM 03/01/2023    3:22 PM  Depression screen PHQ 2/9  Decreased Interest 0 1 1 2 2   Down, Depressed, Hopeless 0 2 1 2 2   PHQ - 2 Score 0 3 2 4 4   Altered sleeping 0 0 2 2 3   Tired, decreased energy 0 0 2 2 2   Change in appetite 0 0 2 1 1   Feeling bad or failure about yourself  1 2 2 3  0  Trouble concentrating 0 0 1 2 2   Moving slowly or fidgety/restless 0 0 1 1 1   Suicidal thoughts 0 0 1 2 1   PHQ-9 Score 1 5 13 17 14   Difficult doing work/chores Somewhat difficult Somewhat difficult Somewhat difficult Very difficult Somewhat difficult   Suicidal/Homicidal: Nowithout intent/plan  Therapist Response: Clinician utilized CBT, solution focused, and supportive reflection interventions to address presenting challenges and management of presenting sxs.  Clinician ***    actively greeted patient upon joining visit, engaging in introductory check-in, assessing presenting moods and affect, eliciting pt's reflection of events and factors contributing to improvements in moods over recent weeks. Actively listened to pt's recounts of events, utilizing open ended questions to further explore presenting stressors and efforts at managing such. Utilized CBT and psycho-ed interventions to support pt in greater overview of anxiety and how this proves to present in various individuals, types of anxiousness, and means of managing.  Clinician reassessed severity of presenting sxs, and presence of any safety concerns. Clinician provided support and empathy to patient during session.  Plan: Return again in 1 weeks.  Diagnosis:  Encounter Diagnoses  Name Primary?   Moderate episode of recurrent major depressive disorder (HCC) Yes   Generalized anxiety disorder     Collaboration of Care: Psychiatrist AEB provider notes available in EHR.  Patient/Guardian was advised Release of Information must be obtained prior to any record  release in order to collaborate their care with an outside provider. Patient/Guardian was advised if they have not already done so to contact the registration department to sign all necessary forms in order for us  to release information regarding their care.   Consent: Patient/Guardian gives verbal consent for treatment and assignment of benefits for services provided during this visit. Patient/Guardian expressed understanding and agreed to proceed.   Patsi Boots, MSW, LCSW 08/08/2023,  3:07 PM

## 2023-08-15 ENCOUNTER — Ambulatory Visit (HOSPITAL_COMMUNITY): Admitting: Licensed Clinical Social Worker

## 2023-08-15 DIAGNOSIS — F331 Major depressive disorder, recurrent, moderate: Secondary | ICD-10-CM | POA: Diagnosis not present

## 2023-08-15 NOTE — Progress Notes (Signed)
 THERAPIST PROGRESS NOTE   Session Date: 08/15/2023  Session Time: 1304 - 1350 Virtual Visit via Video Note  I connected with John King on 08/15/23 at  1:00 PM EDT by a video enabled telemedicine application and verified that I am speaking with the correct person using two identifiers.  Location: Patient: Home Provider: Home office   I discussed the limitations of evaluation and management by telemedicine and the availability of in person appointments. The patient expressed understanding and agreed to proceed.  The patient was advised to call back or seek an in-person evaluation if the symptoms worsen or if the condition fails to improve as anticipated.  I provided 45 minutes of non-face-to-face time during this encounter.  Participation Level: Active  Behavioral Response: CasualAlertAnxious and Depressed  Type of Therapy: Individual Therapy  Treatment Goals addressed:  - Report a decrease in anxiety symptoms as evidenced by an overall reduction in anxiety score by a minimum of 25% on the Generalized Anxiety Disorder Scale (GAD-7) (Anxiety) - John King will reduce frequency of avoidant behaviors by 50% as evidenced by self-report in therapy sessions (OP Depression) - "Be able to be comfortable with myself" (Anxiety) - Reduce frequency, intensity, and duration of depression symptoms so that daily functioning is improved (OP Depression) - Increase coping skills to manage depression and improve ability to perform daily activities (OP Depression) - Reduce overall depression score by a minimum of 25% on the Patient Health Questionnaire (PHQ-9) (OP Depression) - "I just want to be able to go outside and smile and be happy again" (OP Depression)  Progress Towards Goals: Progressing  Interventions: CBT, Solution Focused, and Supportive  Summary: John King is a 28 y.o. male with past psych history of MDD and GAD, presenting for follow-up therapy session in efforts to improve management  of presenting depressive and anxious symptoms.   Patient actively engaged in session, presenting in overall pleasant moods and congruent affect, actively engaging in introductory check-in, sharing of things to be going well, having been working, improved energy, finding self trying to stay positive. Reassessed depressive and anx. Sxs, via PHQ-9 and GAD-7, noting of continued downward trends/low scoring, processing observed sxs., and presenting challenges. Reports pSI, noticing in conversations with romantic interest, family members, others, etc. that "feels the person really doesn't want me here", thinking would be better off.Find that having bad days, will lead to pSI, thoughts of being the black sheep of the family, feeling nobody cares unless having seizure or sick. Reflected on prior discussion surrounding boundaries and how this has supported pt's thoughts and feelings surrounding relationships to include familial, romantic, and within professional settings. Finding improved abilities at telling people "no", and being firm in implemented expected interactions with others. Finding benefits at work, specifically at [employer], finding not leaving work frustrated over the past week. Processed how this proves to be impacting relationship situation, and noticing abilities to come to agreements and conclusions rather than arguing. Pt actively engaged in further discussion surrounding efforts to remain optimistic and think positively to improve overall moods.  Patient responded well to interventions. Patient continues to meet criteria for MDD and GAD. Patient will continue to benefit from engagement in outpatient therapy due to being the least restrictive service to meet presenting needs.      08/15/2023    1:13 PM 08/01/2023    3:16 PM 07/20/2023   11:38 AM 06/17/2023   10:18 AM  GAD 7 : Generalized Anxiety Score  Nervous, Anxious, on Edge 1 0 2  2  Control/stop worrying 1 1 2 2   Worry too much -  different things 1 0 2 3  Trouble relaxing 0 0 1 2  Restless 0 0 1 1  Easily annoyed or irritable 0 1 2 2   Afraid - awful might happen 0 0 2 1  Total GAD 7 Score 3 2 12 13   Anxiety Difficulty Somewhat difficult Somewhat difficult Very difficult Somewhat difficult      08/15/2023    1:16 PM 08/01/2023    3:09 PM 07/20/2023   11:40 AM 06/17/2023   10:13 AM 04/26/2023    3:37 PM  Depression screen PHQ 2/9  Decreased Interest 0 0 1 1 2   Down, Depressed, Hopeless 0 0 2 1 2   PHQ - 2 Score 0 0 3 2 4   Altered sleeping 0 0 0 2 2  Tired, decreased energy 1 0 0 2 2  Change in appetite 0 0 0 2 1  Feeling bad or failure about yourself  1 1 2 2 3   Trouble concentrating 0 0 0 1 2  Moving slowly or fidgety/restless 0 0 0 1 1  Suicidal thoughts 1 0 0 1 2  PHQ-9 Score 3 1 5 13 17   Difficult doing work/chores Not difficult at all Somewhat difficult Somewhat difficult Somewhat difficult Very difficult   Suicidal/Homicidal: Nowithout intent/plan  Therapist Response: Clinician utilized CBT, solution focused, and supportive reflection interventions to address presenting challenges and management of presenting sxs.  Clinician actively greeted patient upon presenting for today's virtual visit, assessing presenting moods and affect, actively engaging patient in introductory check-in, and prompting patient's recounts of events and factors contributing to presenting moods.  Actively engaged patient in reassessing presenting depressive symptoms via PHQ-9, and anxious symptoms via GAD-7, noting of continued downward trend of screenings, further evoking patient's reflection of observed symptoms and experience challenges.  Revisited prior areas of discussion surrounding boundaries, utilizing open-ended questions to further elicit patient's thoughts and feelings surrounding recent events and efforts at implementing such methods, further prompting patient's exploration of ways in which patient has observed  increased/improved boundary implementation to be impacting interactions with others and improving individual feelings of self and moods.  Clinician reassessed severity of presenting sxs, and presence of any safety concerns. Clinician provided support and empathy to patient during session.  Plan: Return again in 1 weeks.  Diagnosis:  Encounter Diagnosis  Name Primary?   Moderate episode of recurrent major depressive disorder (HCC) Yes     Collaboration of Care: Psychiatrist AEB provider notes available in EHR.  Patient/Guardian was advised Release of Information must be obtained prior to any record release in order to collaborate their care with an outside provider. Patient/Guardian was advised if they have not already done so to contact the registration department to sign all necessary forms in order for us  to release information regarding their care.   Consent: Patient/Guardian gives verbal consent for treatment and assignment of benefits for services provided during this visit. Patient/Guardian expressed understanding and agreed to proceed.   Patsi Boots, MSW, LCSW 08/15/2023,  1:38 PM

## 2023-08-22 ENCOUNTER — Other Ambulatory Visit: Payer: Self-pay | Admitting: Neurology

## 2023-08-22 ENCOUNTER — Ambulatory Visit (HOSPITAL_COMMUNITY): Admitting: Licensed Clinical Social Worker

## 2023-08-24 ENCOUNTER — Ambulatory Visit (INDEPENDENT_AMBULATORY_CARE_PROVIDER_SITE_OTHER): Admitting: Licensed Clinical Social Worker

## 2023-08-24 NOTE — Progress Notes (Signed)
 THERAPIST PROGRESS NOTE   Session Date: 08/24/2023  Session Time: 8295 - 0908  Virtual Visit via Video Note  I connected with John King on 08/24/23 at  9:05 AM EDT by a video enabled telemedicine application and verified that I am speaking with the correct person using two identifiers.  Location: Patient: Home Provider: [facility]   I discussed the limitations of evaluation and management by telemedicine and the availability of in person appointments. The patient expressed understanding and agreed to proceed.   The patient was advised to call back or seek an in-person evaluation if the symptoms worsen or if the condition fails to improve as anticipated.  I provided 3 minutes of non-face-to-face time during this encounter.   Clinician connected with pt for scheduled visit, engaging pt in introductory check-in, assessing presentation, actively listening to pt's reports of having been vomiting for the past 20 minutes, pt appeared distressed, with flush faced, and exhaustion. Pt proved receptive to rescheduling visit to allow to tend to physical sxs.  Clinician confirmed pt's next scheduled appt.   Patsi Boots, MSW, LCSW 08/24/2023,  9:07 AM

## 2023-08-29 ENCOUNTER — Ambulatory Visit (INDEPENDENT_AMBULATORY_CARE_PROVIDER_SITE_OTHER): Admitting: Licensed Clinical Social Worker

## 2023-08-29 DIAGNOSIS — F331 Major depressive disorder, recurrent, moderate: Secondary | ICD-10-CM

## 2023-08-29 DIAGNOSIS — F411 Generalized anxiety disorder: Secondary | ICD-10-CM

## 2023-08-29 NOTE — Progress Notes (Signed)
 THERAPIST PROGRESS NOTE   Session Date: 08/29/2023  Session Time: 1308 - 1353 Virtual Visit via Video Note  I connected with John King on 08/29/23 at  1:00 PM EDT by a video enabled telemedicine application and verified that I am speaking with the correct person using two identifiers.  Location: Patient: Home Provider: Home office   I discussed the limitations of evaluation and management by telemedicine and the availability of in person appointments. The patient expressed understanding and agreed to proceed.  The patient was advised to call back or seek an in-person evaluation if the symptoms worsen or if the condition fails to improve as anticipated.  I provided 44 minutes of non-face-to-face time during this encounter.  Participation Level: Active  Behavioral Response: CasualAlertAnxious and Depressed  Type of Therapy: Individual Therapy  Treatment Goals addressed:  - Report a decrease in anxiety symptoms as evidenced by an overall reduction in anxiety score by a minimum of 25% on the Generalized Anxiety Disorder Scale (GAD-7) (Anxiety) - Kye will reduce frequency of avoidant behaviors by 50% as evidenced by self-report in therapy sessions (OP Depression) - "Be able to be comfortable with myself" (Anxiety) - Reduce frequency, intensity, and duration of depression symptoms so that daily functioning is improved (OP Depression) - Increase coping skills to manage depression and improve ability to perform daily activities (OP Depression) - Reduce overall depression score by a minimum of 25% on the Patient Health Questionnaire (PHQ-9) (OP Depression) - "I just want to be able to go outside and smile and be happy again" (OP Depression)  Progress Towards Goals: Progressing  Interventions: CBT, Solution Focused, and Supportive  Summary: John King is a 28 y.o. male with past psych history of MDD and GAD, presenting for follow-up therapy session in efforts to improve management  of presenting depressive and anxious symptoms.   Patient actively engaged in session, presenting in depressed and anxious moods and congruent affect throughout duration of visit. Pt actively engaged in introductory check-in, sharing of having had a seizure last week while ill. Reassessed depressive and anxious sxs, via PHQ-9 and GAD-7, noticing increased tension/pressure in head, feeling sense of emptiness possibly relating to feeling not doing enough, feeling as if is stuck in a cycle, reflecting on seizure last week triggering thoughts and feelings of being stuck in a cycle. Processed thoughts and feeling of wanting to do better financially, and feeling outside of work pt has no routine. Explored possible efforts pt can explore in aims of increasing routine/structure to aid in management of feeling isolated and negative feelings surrounding limitations.  Patient responded well to interventions. Patient continues to meet criteria for MDD and GAD. Patient will continue to benefit from engagement in outpatient therapy due to being the least restrictive service to meet presenting needs.      08/29/2023    1:15 PM 08/15/2023    1:13 PM 08/01/2023    3:16 PM 07/20/2023   11:38 AM  GAD 7 : Generalized Anxiety Score  Nervous, Anxious, on Edge 1 1 0 2  Control/stop worrying 1 1 1 2   Worry too much - different things 1 1 0 2  Trouble relaxing 0 0 0 1  Restless 0 0 0 1  Easily annoyed or irritable 0 0 1 2  Afraid - awful might happen 0 0 0 2  Total GAD 7 Score 3 3 2 12   Anxiety Difficulty Somewhat difficult Somewhat difficult Somewhat difficult Very difficult      08/29/2023  1:46 PM 08/15/2023    1:16 PM 08/01/2023    3:09 PM 07/20/2023   11:40 AM 06/17/2023   10:13 AM  Depression screen PHQ 2/9  Decreased Interest 0 0 0 1 1  Down, Depressed, Hopeless 0 0 0 2 1  PHQ - 2 Score 0 0 0 3 2  Altered sleeping 0 0 0 0 2  Tired, decreased energy 0 1 0 0 2  Change in appetite 0 0 0 0 2  Feeling bad or  failure about yourself  0 1 1 2 2   Trouble concentrating 0 0 0 0 1  Moving slowly or fidgety/restless 0 0 0 0 1  Suicidal thoughts 0 1 0 0 1  PHQ-9 Score 0 3 1 5 13   Difficult doing work/chores  Not difficult at all Somewhat difficult Somewhat difficult Somewhat difficult   Suicidal/Homicidal: Nowithout intent/plan  Therapist Response: Clinician utilized CBT, solution focused, and supportive reflection interventions to address presenting challenges and management of presenting sxs.  Clinician actively greeted patient upon presenting for today's virtual visit, actively engaging in introductory check-in, assessing presenting moods and affect, prompting patient's recounts of past weekly events and factors contributing to presenting moods.  Actively listened to the patient's reflections of recent events and factors contributing to presenting moods, providing support and validation for patient's expressed thoughts and feelings surrounding recent challenges, specifically processing stressors related to patient reported recent seizure, and factors contributing to frustrations and patient's feelings of limitation.  Actively engaged patient in reassessing presenting depressive and anxious symptoms via PHQ-9 and GAD-7, noting of reduction in depressive symptoms and moderate increase in anxious symptoms, further processing symptoms and factors related to increased stress.  Supported patient in challenging cognitions and reframing of thoughts and efforts to support patient in navigating presenting challenges.  Utilized open-ended questions to further elicit patient's thoughts and feelings surrounding efforts towards increasing socialization and adding routine to daily schedule.  Clinician reassessed severity of presenting sxs, and presence of any safety concerns. Clinician provided support and empathy to patient during session.  Plan: Return again in 1 weeks.  Diagnosis:  Encounter Diagnoses  Name Primary?    Moderate episode of recurrent major depressive disorder (HCC) Yes   Generalized anxiety disorder       Collaboration of Care: Psychiatrist AEB provider notes available in EHR.  Patient/Guardian was advised Release of Information must be obtained prior to any record release in order to collaborate their care with an outside provider. Patient/Guardian was advised if they have not already done so to contact the registration department to sign all necessary forms in order for us  to release information regarding their care.   Consent: Patient/Guardian gives verbal consent for treatment and assignment of benefits for services provided during this visit. Patient/Guardian expressed understanding and agreed to proceed.   Patsi Boots, MSW, LCSW 08/29/2023,  1:48 PM

## 2023-08-31 ENCOUNTER — Ambulatory Visit (INDEPENDENT_AMBULATORY_CARE_PROVIDER_SITE_OTHER): Admitting: Neurology

## 2023-08-31 ENCOUNTER — Encounter: Payer: Self-pay | Admitting: Neurology

## 2023-08-31 VITALS — BP 100/65 | HR 52 | Ht 70.0 in | Wt 132.2 lb

## 2023-08-31 DIAGNOSIS — G40009 Localization-related (focal) (partial) idiopathic epilepsy and epileptic syndromes with seizures of localized onset, not intractable, without status epilepticus: Secondary | ICD-10-CM

## 2023-08-31 MED ORDER — VALTOCO 15 MG DOSE 2 X 7.5 MG/0.1ML NA LQPK: NASAL | 5 refills | Status: DC

## 2023-08-31 MED ORDER — TOPIRAMATE 100 MG PO TABS: ORAL_TABLET | ORAL | 11 refills | Status: DC

## 2023-08-31 MED ORDER — ONDANSETRON 4 MG PO TBDP: 4.0000 mg | ORAL_TABLET | Freq: Three times a day (TID) | ORAL | 6 refills | Status: AC | PRN

## 2023-08-31 NOTE — Patient Instructions (Signed)
 Good to see you.  Schedule 2-day home EEG  2. Continue Topiramate  100mg : take 1 tablet in AM, 1 and 1/2 tablets in PM  3. Refills sent for Valtoco  and Zofran   4. Avoid alcohol  5. Follow-up in 3 months, call for any changes   Seizure precautions: 1. If medication has been prescribed for you to prevent seizures, take it exactly as directed.  Do not stop taking the medicine without talking to your doctor first, even if you have not had a seizure in a long time.   2. Avoid activities in which a seizure would cause danger to yourself or to others.  Don't operate dangerous machinery, swim alone, or climb in high or dangerous places, such as on ladders, roofs, or girders.  Do not drive unless your doctor says you may.  3. If you have any warning that you may have a seizure, lay down in a safe place where you can't hurt yourself.    4.  No driving for 6 months from last seizure, as per [place]  state law.   Please refer to the following link on the [employer]'s website for more information: http://www.epilepsyfoundation.org/answerplace/Social/driving/drivingu.cfm   5.  Maintain good sleep hygiene.   6.  Contact your doctor if you have any problems that may be related to the medicine you are taking.  7.  Call 911 and bring the patient back to the ED if:        A.  The seizure lasts longer than 5 minutes.       B.  The patient doesn't awaken shortly after the seizure  C.  The patient has new problems such as difficulty seeing, speaking or moving  D.  The patient was injured during the seizure  E.  The patient has a temperature over 102 F (39C)  F.  The patient vomited and now is having trouble breathing

## 2023-08-31 NOTE — Progress Notes (Signed)
 NEUROLOGY FOLLOW UP OFFICE NOTE  SHAHEER BONFIELD [id number] 05-06-1995  HISTORY OF PRESENT ILLNESS: I had the pleasure of seeing Tanmay Halteman in follow-up in the neurology clinic on 08/31/2023.  The patient was last seen 3 months ago for seizures. He is accompanied by his mother and girlfriend [name] who help supplement the history today.  Records and images were personally reviewed where available.  Since his last visit, his mother reports 2 seizures that occurred on 08/24/23. He was asleep when she came home at 4pm, he had not been feeling well, vomiting since he woke up that morning. She then found him with eyes open, unresponsive, head turned to the right. She put ice on his chest and he grabbed her, just looking at her. He started responding 1-2 minutes later, he was red, hot, diaphoretic with a headache and dry heaving. He had not eaten all day. Around 6 hours later, their dog alerted her and she found him having a GTC with head turned to the right, making gurgling sounds, diaphoretic, with urinary incontinence. He was very restless afterwards, confused and incoherent, then again complained of headache. His mother had administered Valtoco . He reports that he had alcohol (2 drinks of tequila) the night prior and was involved in a small argument. No sleep deprivation, no missed medication, he continues on Topiramate  100mg  in AM, 150mg  in PM without side effects.   He reports new symptoms of a "rattle" in his head that he feels/hears in the temples, like a grinding around his ears but not in his ears. It comes and goes occurring around 10 times a week for a few minutes. No hearing loss or tinnitus, no dizziness, confusion, no associated headache/pain. He denies any olfactory/gustatory hallucinations, focal numbness/tingling/weakness, myoclonic jerks.They report memory loss after the seizures. He denies forgetting medications but he feels like he has missed a year after a seizure. 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    History on Initial Assessment 03/24/2022: This is a 28 year old right-handed man presenting for evaluation of new onset seizures. His mother is present to provide additional information. He was in his usual state of health until 02/04/22. He recalls sleeping beside his 29 year old, woke up an hour later feeling fine and going back to sleep. He then woke up and saw the girl's mother with blood around the room. The child had called her mother, he apparently got up from bed, went to the bathroom, then as he came out he fell and had a seizure. He hit the left side of his head and then went back to bed but has no recollection of this. He had bit his tongue, urinary incontinence, and required staples for the head injury. He was brought to the ER where bloodwork showed a WBC of 12, EtOH 10. Head CT no acute changes, venous sinuses were noted to be mildly hyperdense, nonspecific but can be seen in the setting of dehydration. He then had another seizure on 03/15/22. He recalls feeling tired the day prior. He was visiting a friend who told him that something was off when he got there, he went to sleep then woke up to EMS around him. He was brought to [facility], per ER notes, girlfriend woke up to patient having a seizure in bed, he bit his tongue and had nausea/vomiting. Head CT noted a vague small focus of hypoattenuation in the right frontal lobe that is favored to be artifactual. Bloodwork normal. He  was discharged home on Levetiracetam  750mg  BID.He reports missing 2 doses the day before Christmas then having another nocturnal seizure on 03/22/22. He again bit his tongue. He has some drowsiness with the Levetiracetam . He has occasional body twitches in his head, neck, and back that he attributes to bulging discs. His legs would just jump uncontrollable sometimes, twitching and trembling. After a long day, his head may twitch uncontrollably. No associated confusion. This started  around are 22 or 23. He has occasional episodes of deja vu. No staring/unresponsive episodes, focal numbness/tingling/weakness, olfactory/gustatory hallucinations. He used to have migraines but denies any more headaches. No dizziness, diplopia, dysarthria/dysphagia, bowel/bladder dysfunction. He usually gets 7-9 hours of sleep. He drinks 2-3 liquor beverages daily. He smokes marijuana. He denies any alcohol prior to the first seizure but recalls having a drink the night before the second and third seizures. He lives with his mother. Mood is fine.   Epilepsy Risk Factors:  His mother had one seizure and takes Topiramate . HIs father had 2 alcohol-related seizures. He had a normal birth and early development.  There is no history of febrile convulsions, CNS infections such as meningitis/encephalitis, significant traumatic brain injury, neurosurgical procedures, or family history of seizures.  Prior ASMs: Levetiracetam  (mood changes), Zonisamide  (waves of emotions he breaks down and cries everyday)   Diagnostic Data: MRI brain with and without contrast 08/2022 normal, hippocampi symmetric with no abnormal signal or enhancement seen 1-hour EEG in 07/2022 normal   PAST MEDICAL HISTORY: Past Medical History:  Diagnosis Date   Seizures (HCC) 02-05-22    MEDICATIONS: Current Outpatient Medications on File Prior to Visit  Medication Sig Dispense Refill   clindamycin  (CLINDAGEL) 1 % gel Apply topically 2 (two) times daily. 30 g 4   diazePAM , 15 MG Dose, (VALTOCO  15 MG DOSE) 2 x 7.5 MG/0.1ML LQPK Administer one spray in one nostril, other spray in other nostril (one dose) as needed for seizure. May use another dose 4 hours after if needed. 5 each 5   ondansetron  (ZOFRAN -ODT) 4 MG disintegrating tablet Take 4 mg by mouth every 8 (eight) hours as needed.     topiramate  (TOPAMAX ) 100 MG tablet Take 1 tablet in AM, 1 and 1/2 tablets in PM 75 tablet 11   No current facility-administered medications on file  prior to visit.    ALLERGIES: No Known Allergies  FAMILY HISTORY: Family History  Problem Relation Age of Onset   Seizures Mother        one only in HS   Cancer Mother 2       breast   Hypertension Mother    Hypertension Father    Alcohol abuse Father    Seizures Father        etoh withdrawal   CAD Other        50-60's. maternal side    SOCIAL HISTORY: Social History   Socioeconomic History   Marital status: Single    Spouse name: Not on file   Number of children: 0   Years of education: Not on file   Highest education level: Not on file  Occupational History   Not on file  Tobacco Use   Smoking status: Every Day    Types: Cigars   Smokeless tobacco: Never  Vaping Use   Vaping status: Every Day  Substance and Sexual Activity   Alcohol use: Yes    Alcohol/week: 7.0 standard drinks of alcohol    Types: 7 Shots of liquor per week  Comment: socially drink or two on the weekends   Drug use: Yes    Types: Marijuana    Comment: Daily   Sexual activity: Yes    Partners: Female    Birth control/protection: None  Other Topics Concern   Not on file  Social History Narrative   Unem.  1 step dau.   Occ hosts parties on weekends.   Are you right handed or left handed? Right    Are you currently employed ? No    What is your current occupation?   Do you live at home alone? no   Who lives with you? With family    What type of home do you live in: 1 story or 2 story?  15 steps        Social Drivers of Health   Financial Resource Strain: High Risk (06/17/2023)   Overall Financial Resource Strain (CARDIA)    Difficulty of Paying Living Expenses: Hard  Food Insecurity: Food Insecurity Present (06/17/2023)   Hunger Vital Sign    Worried About Running Out of Food in the Last Year: Sometimes true    Ran Out of Food in the Last Year: Sometimes true  Transportation Needs: No Transportation Needs (06/17/2023)   PRAPARE - Administrator, Civil Service  (Medical): No    Lack of Transportation (Non-Medical): No  Physical Activity: Inactive (06/17/2023)   Exercise Vital Sign    Days of Exercise per Week: 0 days    Minutes of Exercise per Session: 0 min  Stress: Stress Concern Present (06/17/2023)   [employer] of Occupational Health - Occupational Stress Questionnaire    Feeling of Stress : Rather much  Social Connections: Socially Isolated (06/17/2023)   Social Connection and Isolation Panel [NHANES]    Frequency of Communication with Friends and Family: Never    Frequency of Social Gatherings with Friends and Family: Never    Attends Religious Services: 1 to 4 times per year    Active Member of [employer] or Organizations: No    Attends Banker Meetings: Never    Marital Status: Never married  Intimate Partner Violence: Not At Risk (06/17/2023)   Humiliation, Afraid, Rape, and Kick questionnaire    Fear of Current or Ex-Partner: No    Emotionally Abused: No    Physically Abused: No    Sexually Abused: No     PHYSICAL EXAM: Vitals:   08/31/23 1306  BP: 100/65  Pulse: (!) 52  SpO2: 99%   General: No acute distress Head:  Normocephalic/atraumatic Skin/Extremities: No rash, no edema Neurological Exam: alert and awake. No aphasia or dysarthria. Fund of knowledge is appropriate.  Attention and concentration are normal.   Cranial nerves: Pupils equal, round. Extraocular movements intact with no nystagmus. Visual fields full.  No facial asymmetry.  Motor: Bulk and tone normal, muscle strength 5/5 throughout with no pronator drift.   Finger to nose testing intact.  Gait narrow-based and steady, able to tandem walk adequately.  Romberg negative.   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 Brain MRI and EEG normal. He had another cluster of seizures on 5/28. We discussed doing a 48-hour EEG for seizure characterization. He also reports a  "grinding/rattling" in his head. We discussed recent seizure appears to be triggered by alcohol/vomiting, and agreed to continue Topiramate  100mg  in AM, 150mg  in PM. Refills sent for prn Valtoco  and Zofran . We discussed  avoidance of seizure triggers (ie alcohol). He is aware of [place] driving laws to stop driving until 6 months seizure-free. Follow-up in 3 months, call for any changes.    Thank you for allowing me to participate in his care.  Please do not hesitate to call for any questions or concerns.    Rayfield Cairo, M.D.   CC: Dr. Waldo Guitar

## 2023-09-02 ENCOUNTER — Telehealth: Payer: Self-pay | Admitting: *Deleted

## 2023-09-05 ENCOUNTER — Ambulatory Visit (INDEPENDENT_AMBULATORY_CARE_PROVIDER_SITE_OTHER): Payer: PRIVATE HEALTH INSURANCE | Admitting: Licensed Clinical Social Worker

## 2023-09-05 DIAGNOSIS — F331 Major depressive disorder, recurrent, moderate: Secondary | ICD-10-CM

## 2023-09-05 DIAGNOSIS — F411 Generalized anxiety disorder: Secondary | ICD-10-CM

## 2023-09-05 NOTE — Progress Notes (Signed)
 THERAPIST PROGRESS NOTE   Session Date: 09/05/2023  Session Time: 1104 - 1152  Virtual Visit via Video Note  I connected with Oseias C Suess on 09/05/23 at 11:00 AM EDT by a video enabled telemedicine application and verified that I am speaking with the correct person using two identifiers.  Location: Patient: Home Provider: Home office   I discussed the limitations of evaluation and management by telemedicine and the availability of in person appointments. The patient expressed understanding and agreed to proceed.  The patient was advised to call back or seek an in-person evaluation if the symptoms worsen or if the condition fails to improve as anticipated.  I provided 48 minutes of non-face-to-face time during this encounter.  Participation Level: Active  Behavioral Response: CasualAlertAnxious and Depressed  Type of Therapy: Individual Therapy  Treatment Goals addressed:  - Report a decrease in anxiety symptoms as evidenced by an overall reduction in anxiety score by a minimum of 25% on the Generalized Anxiety Disorder Scale (GAD-7) (Anxiety) - [place] will reduce frequency of avoidant behaviors by 50% as evidenced by self-report in therapy sessions (OP Depression) - "Be able to be comfortable with myself" (Anxiety) - Reduce frequency, intensity, and duration of depression symptoms so that daily functioning is improved (OP Depression) - Increase coping skills to manage depression and improve ability to perform daily activities (OP Depression) - Reduce overall depression score by a minimum of 25% on the Patient Health Questionnaire (PHQ-9) (OP Depression) - "I just want to be able to go outside and smile and be happy again" (OP Depression)  Progress Towards Goals: Not Progressing  Interventions: CBT, Solution Focused, and Supportive  Summary: Yandriel is a 28 y.o. male with past psych history of MDD and GAD, presenting for follow-up therapy session in efforts to improve  management of presenting depressive and anxious symptoms.   Patient actively engaged in session, presenting in depressed and anxious moods and congruent affect throughout duration of visit. Pt actively engaged in introductory check-in, sharing of having experienced increased anxiety over the past week, attributing to seizure experienced two weeks ago, sharing of having been over-thinking every simple decision. Work is the calmest place he is, experiencing trouble focusing on tasks. Reviewed recent neurology visit, processing plans for at home EKG to assess brain functioning. Reports of having tried relaxing and staying focused, staying to self, listening to music, watching TV, focusing on work, and keep a level head. Feels saddened by the limitations of having recent seizure adding 6 more months of driving limitations. Revisited romantic interest and challenges surrounding current nature of relationship, difficulties implementing appropriate boundaries, and pt's desires for relationship to be conflicting to those of romantic interests current desires.    Patient responded well to interventions. Patient continues to meet criteria for MDD and GAD. Patient will continue to benefit from engagement in outpatient therapy due to being the least restrictive service to meet presenting needs.      08/29/2023    1:15 PM 08/15/2023    1:13 PM 08/01/2023    3:16 PM 07/20/2023   11:38 AM  GAD 7 : Generalized Anxiety Score  Nervous, Anxious, on Edge 1 1 0 2  Control/stop worrying 1 1 1 2   Worry too much - different things 1 1 0 2  Trouble relaxing 0 0 0 1  Restless 0 0 0 1  Easily annoyed or irritable 0 0 1 2  Afraid - awful might happen 0 0 0 2  Total GAD 7 Score 3  3 2 12   Anxiety Difficulty Somewhat difficult Somewhat difficult Somewhat difficult Very difficult      08/29/2023    1:46 PM 08/15/2023    1:16 PM 08/01/2023    3:09 PM 07/20/2023   11:40 AM 06/17/2023   10:13 AM  Depression screen PHQ 2/9   Decreased Interest 0 0 0 1 1  Down, Depressed, Hopeless 0 0 0 2 1  PHQ - 2 Score 0 0 0 3 2  Altered sleeping 0 0 0 0 2  Tired, decreased energy 0 1 0 0 2  Change in appetite 0 0 0 0 2  Feeling bad or failure about yourself  0 1 1 2 2   Trouble concentrating 0 0 0 0 1  Moving slowly or fidgety/restless 0 0 0 0 1  Suicidal thoughts 0 1 0 0 1  PHQ-9 Score 0 3 1 5 13   Difficult doing work/chores  Not difficult at all Somewhat difficult Somewhat difficult Somewhat difficult   Suicidal/Homicidal: Nowithout intent/plan  Therapist Response: Clinician utilized CBT, solution focused, and supportive reflection interventions to address presenting challenges and management of presenting sxs.  Clinician actively greeted patient upon joining today's virtual visit, assessing presenting moods and affect, engaging in introductory check-in and further prompting pt's recounts of past weekly events and factors contributing to presenting moods. Actively listened to pt's recounts of events of the past week, providing support and validation of expressed thoughts and feelings surrounding presenting stressors and increased anxiousness as a result of recent seizure, further prolonging pt's medical limitations. Utilized open ended questions to further evoke pt's thoughts and feelings surrounding presenting stressors, further processing pt's identified negative thoughts in relation to recent events, supporting pt in challenging such thoughts in efforts to process abilities at reframing and improving perspectives. Revisited pt's efforts at increasing social activity and interactions with peers, processing barriers to doing so, and further encouraging individual exploration of activities pt finds enjoyable and interested in engaging in as a means of establishing schedule/routine outside of just work.  Clinician reassessed severity of presenting sxs, and presence of any safety concerns. Clinician provided support and empathy to  patient during session.  Plan: Return again in 2 weeks.  Diagnosis:  Encounter Diagnosis  Name Primary?   Moderate episode of recurrent major depressive disorder (HCC) Yes    Collaboration of Care: Psychiatrist AEB provider notes available in EHR.  Patient/Guardian was advised Release of Information must be obtained prior to any record release in order to collaborate their care with an outside provider. Patient/Guardian was advised if they have not already done so to contact the registration department to sign all necessary forms in order for us  to release information regarding their care.   Consent: Patient/Guardian gives verbal consent for treatment and assignment of benefits for services provided during this visit. Patient/Guardian expressed understanding and agreed to proceed.   Patsi Boots, MSW, LCSW 09/05/2023,  11:05 AM

## 2023-09-06 ENCOUNTER — Telehealth: Payer: Self-pay | Admitting: *Deleted

## 2023-09-19 ENCOUNTER — Encounter (HOSPITAL_COMMUNITY): Payer: Self-pay

## 2023-09-19 ENCOUNTER — Ambulatory Visit (INDEPENDENT_AMBULATORY_CARE_PROVIDER_SITE_OTHER): Payer: PRIVATE HEALTH INSURANCE | Admitting: Licensed Clinical Social Worker

## 2023-09-19 DIAGNOSIS — Z91199 Patient's noncompliance with other medical treatment and regimen due to unspecified reason: Secondary | ICD-10-CM

## 2023-09-19 NOTE — Progress Notes (Signed)
 THERAPIST PROGRESS NOTE   Session Date: 09/19/2023  Session Time: 1100  John King    Clinician attempted to connect with patient for scheduled appointment via Caregility video, sending text request x3 with no response.     Attempt 1: Text: 1105   Attempt 2: Text: 1108    Attempt 3: Text: 1109   Disconnected video visit at:  1112     Per [facility] policy, after multiple attempts to reach patient unsuccessfully at appointed time, visit will be coded as a no show.  John King, MSW, LCSW 09/19/2023,  11:12 AM

## 2023-09-29 ENCOUNTER — Encounter (HOSPITAL_COMMUNITY): Payer: Self-pay

## 2023-09-29 ENCOUNTER — Ambulatory Visit (INDEPENDENT_AMBULATORY_CARE_PROVIDER_SITE_OTHER): Payer: PRIVATE HEALTH INSURANCE | Admitting: Licensed Clinical Social Worker

## 2023-09-29 DIAGNOSIS — Z91199 Patient's noncompliance with other medical treatment and regimen due to unspecified reason: Secondary | ICD-10-CM

## 2023-09-29 NOTE — Progress Notes (Signed)
 THERAPIST PROGRESS NOTE   Session Date: 09/29/2023  Session Time: 1100  [name]    Clinician attempted to connect with patient for scheduled appointment via Caregility video, sending text request x3 with no response.     Attempt 1: Text: 1107   Attempt 2: Text: 1111    Attempt 3: Text: 1112   Disconnected video visit at:  1113     Per [facility] policy, after multiple attempts to reach patient unsuccessfully at appointed time, visit will be coded as a no show.  Lynwood JONETTA Maris, MSW, LCSW 09/29/2023,  11:13 AM

## 2023-10-10 ENCOUNTER — Ambulatory Visit (INDEPENDENT_AMBULATORY_CARE_PROVIDER_SITE_OTHER): Admitting: Neurology

## 2023-10-10 ENCOUNTER — Ambulatory Visit: Payer: 59 | Admitting: Neurology

## 2023-10-10 DIAGNOSIS — G40009 Localization-related (focal) (partial) idiopathic epilepsy and epileptic syndromes with seizures of localized onset, not intractable, without status epilepticus: Secondary | ICD-10-CM | POA: Diagnosis not present

## 2023-10-10 NOTE — Progress Notes (Signed)
 Ambulatory EEG hooked up and running. Light flashing. Push button tested. Camera and event log explained. Batteries explained. Patient understood.   Patient aware that not having a routine EEG may cause issues with insurance covering this procedure but pt wanted to continue anyway. Pt was given option to remove electrodes and reschedule routine then another appt for ambulatory but said since electrodes were on to just continue with appt.

## 2023-10-12 NOTE — Progress Notes (Signed)
 AMB EEG discontinued.  Skin Breakdown:No Diary Returned: Yes

## 2023-10-17 ENCOUNTER — Ambulatory Visit (HOSPITAL_COMMUNITY): Payer: PRIVATE HEALTH INSURANCE | Admitting: Licensed Clinical Social Worker

## 2023-10-26 NOTE — Procedures (Signed)
 ELECTROENCEPHALOGRAM REPORT  Dates of Recording: 10/10/2023 10:52AM to 10/12/2023 11:35AM   Patient's Name: John King MRN: [id number] Date of Birth: August 28, 1995  Referring Provider: Dr. Darice Shivers  Procedure: 48-hour ambulatory video EEG  History: This is a 28 year old man with recurrent seizures with recent cluster of seizures in 07/2023. He also reports a rattle/grinding in his head. EEG for characterization  Medications: Topiramate   Technical Summary: This is a 48-hour multichannel digital video EEG recording measured by the international 10-20 system with electrodes applied with paste and impedances below 5000 ohms performed as portable with EKG monitoring.  The digital EEG was referentially recorded, reformatted, and digitally filtered in a variety of bipolar and referential montages for optimal display.    DESCRIPTION OF RECORDING: During maximal wakefulness, the background activity consisted of a symmetric 9-10 Hz posterior dominant rhythm which was reactive to eye opening.  There were no epileptiform discharges or focal slowing seen in wakefulness.  During the recording, the patient progresses through wakefulness, drowsiness, and Stage 2 sleep.  Again, there were no epileptiform discharges seen.  Events: There were 15 push button events that appear accidental. No video except for the last 3 push buttons which are accidental. No symptoms reported. Electrographically, there were no EEG or EKG changes seen.  There were no electrographic seizures seen.  EKG lead was unremarkable.  IMPRESSION: This 48-hour ambulatory video EEG study is normal.    CLINICAL CORRELATION: A normal EEG does not exclude a clinical diagnosis of epilepsy.  If further clinical questions remain, inpatient video EEG monitoring may be helpful.   Darice Shivers, M.D.

## 2023-10-28 ENCOUNTER — Ambulatory Visit: Payer: Self-pay | Admitting: Neurology

## 2023-10-28 ENCOUNTER — Encounter: Payer: Self-pay | Admitting: *Deleted

## 2023-10-31 ENCOUNTER — Ambulatory Visit: Payer: 59 | Admitting: Neurology

## 2023-11-23 ENCOUNTER — Encounter (HOSPITAL_COMMUNITY): Payer: Self-pay

## 2023-11-23 ENCOUNTER — Other Ambulatory Visit: Payer: Self-pay

## 2023-11-23 ENCOUNTER — Observation Stay (HOSPITAL_COMMUNITY)
Admission: EM | Admit: 2023-11-23 | Discharge: 2023-11-25 | Disposition: A | Discharge status: Home / Self Care | Attending: Internal Medicine | Admitting: Internal Medicine

## 2023-11-23 DIAGNOSIS — G40919 Epilepsy, unspecified, intractable, without status epilepticus: Secondary | ICD-10-CM | POA: Diagnosis present

## 2023-11-23 DIAGNOSIS — R569 Unspecified convulsions: Principal | ICD-10-CM

## 2023-11-23 DIAGNOSIS — R9431 Abnormal electrocardiogram [ECG] [EKG]: Secondary | ICD-10-CM | POA: Diagnosis not present

## 2023-11-23 DIAGNOSIS — I213 ST elevation (STEMI) myocardial infarction of unspecified site: Secondary | ICD-10-CM | POA: Diagnosis not present

## 2023-11-23 DIAGNOSIS — G40909 Epilepsy, unspecified, not intractable, without status epilepticus: Secondary | ICD-10-CM | POA: Diagnosis present

## 2023-11-23 DIAGNOSIS — R231 Pallor: Secondary | ICD-10-CM | POA: Diagnosis not present

## 2023-11-23 DIAGNOSIS — G4489 Other headache syndrome: Secondary | ICD-10-CM | POA: Diagnosis not present

## 2023-11-23 DIAGNOSIS — R519 Headache, unspecified: Secondary | ICD-10-CM | POA: Insufficient documentation

## 2023-11-23 DIAGNOSIS — F17290 Nicotine dependence, other tobacco product, uncomplicated: Secondary | ICD-10-CM | POA: Diagnosis not present

## 2023-11-23 LAB — CBC
HCT: 43.5 % (ref 39.0–52.0)
Hemoglobin: 14.1 g/dL (ref 13.0–17.0)
MCH: 30 pg (ref 26.0–34.0)
MCHC: 32.4 g/dL (ref 30.0–36.0)
MCV: 92.6 fL (ref 80.0–100.0)
Platelets: 188 K/uL (ref 150–400)
RBC: 4.7 MIL/uL (ref 4.22–5.81)
RDW: 13.8 % (ref 11.5–15.5)
WBC: 6.7 K/uL (ref 4.0–10.5)
nRBC: 0 % (ref 0.0–0.2)

## 2023-11-23 LAB — I-STAT CHEM 8, ED
BUN: 13 mg/dL (ref 6–20)
Calcium, Ion: 1 mmol/L — ABNORMAL LOW (ref 1.15–1.40)
Chloride: 109 mmol/L (ref 98–111)
Creatinine, Ser: 1 mg/dL (ref 0.61–1.24)
Glucose, Bld: 90 mg/dL (ref 70–99)
HCT: 44 % (ref 39.0–52.0)
Hemoglobin: 15 g/dL (ref 13.0–17.0)
Potassium: 4.1 mmol/L (ref 3.5–5.1)
Sodium: 139 mmol/L (ref 135–145)
TCO2: 17 mmol/L — ABNORMAL LOW (ref 22–32)

## 2023-11-23 MED ORDER — LEVETIRACETAM (KEPPRA) 500 MG/5 ML ADULT IV PUSH
1500.0000 mg | Freq: Once | INTRAVENOUS | Status: AC
  Administered 2023-11-23: 1500 mg via INTRAVENOUS
  Filled 2023-11-23: qty 15

## 2023-11-23 MED ORDER — LORAZEPAM 2 MG/ML IJ SOLN
2.0000 mg | Freq: Once | INTRAMUSCULAR | Status: AC
  Administered 2023-11-23: 2 mg via INTRAVENOUS

## 2023-11-23 MED ORDER — MAGNESIUM SULFATE IN D5W 1-5 GM/100ML-% IV SOLN
1.0000 g | Freq: Once | INTRAVENOUS | Status: AC
  Administered 2023-11-23: 1 g via INTRAVENOUS
  Filled 2023-11-23: qty 100

## 2023-11-23 MED ORDER — LORAZEPAM 2 MG/ML IJ SOLN
INTRAMUSCULAR | Status: AC
  Filled 2023-11-23: qty 1

## 2023-11-23 MED ORDER — HYDROXYZINE HCL 25 MG PO TABS: 25.0000 mg | ORAL_TABLET | Freq: Every evening | ORAL | 0 refills | Status: DC | PRN

## 2023-11-23 MED ORDER — TOPIRAMATE 100 MG PO TABS
200.0000 mg | ORAL_TABLET | Freq: Every day | ORAL | Status: DC
  Administered 2023-11-24 (×2): 200 mg via ORAL
  Filled 2023-11-23 (×2): qty 2

## 2023-11-23 MED ORDER — TOPIRAMATE 100 MG PO TABS
100.0000 mg | ORAL_TABLET | Freq: Every day | ORAL | Status: DC
  Administered 2023-11-24 – 2023-11-25 (×2): 100 mg via ORAL
  Filled 2023-11-23 (×2): qty 1

## 2023-11-23 NOTE — Discharge Instructions (Addendum)
 You were seen this evening for a possible seizure. Your lab work was reassuring. I have prescribed a medication to help with sleep if needed. Follow up with your primary care team including your neurology team for further evaluation as needed. If you experience life threatening symptoms such as having the worst headache of your life, call 911 or return to the emergency department.

## 2023-11-23 NOTE — ED Notes (Signed)
 Pts family notified staff that pt was actively having seizure. Other staff assisted pt at the bedside, this RN notified EDP, verbal order given for 2mg  IV ativan .

## 2023-11-23 NOTE — ED Triage Notes (Signed)
 Pt BIB [facility] from work. Pt had a witnessed grand mal seizure. Pt was lowered down to a chair, did not fall or hit head. Lasted 2 min. Hx seizures, pt takes topamax . Last was in May. Pt endorses HA, n/v. GCS 15  [facility] 118/60BP 50P 100% RA 108 cbg 18 RFA 4mg  zofran  500 NS

## 2023-11-23 NOTE — ED Provider Notes (Signed)
 [facility] EMERGENCY DEPARTMENT AT [facility] Provider Note   CSN: [id number] Arrival date & time: 11/23/23  2152     Patient presents with: Seizures   John King is a 28 y.o. male.  Patient with past medical history significant for seizure disorder, generalized anxiety disorder, adjustment insomnia presents to the emergency department complaining of what was reported to be a grand mal seizure.  Patient was at work when he began to act strange according to coworkers.  They lowered him to a chair.  He did not fall or hit his head.  The activity reportedly involve his entire body and lasted for approximately 2 minutes.  The patient takes Topamax  for his seizure disorder and states he has missed no doses.  He does endorse not sleeping for the past 2 nights due to stressors and believes this may have driven his seizure this evening.  He denies any fever, nausea, vomiting.  He does endorse a headache but states this is very typical after he experiences a seizure.  His last seizure was reported to be in May of this year.  Patient denies urinary incontinence, any intraoral injury.  {Add pertinent medical, surgical, social history, OB history to HPI:32947}  Seizures      Prior to Admission medications   Medication Sig Start Date End Date Taking? Authorizing Provider  clindamycin  (CLINDAGEL) 1 % gel Apply topically 2 (two) times daily. 05/03/22   Wendolyn Jenkins Jansky, MD  diazePAM , 15 MG Dose, (VALTOCO  15 MG DOSE) 2 x 7.5 MG/0.1ML LQPK Administer one spray in one nostril, other spray in other nostril (one dose) as needed for seizure. May use another dose 4 hours after if needed. 08/31/23   Georjean Darice HERO, MD  ondansetron  (ZOFRAN -ODT) 4 MG disintegrating tablet Take 1 tablet (4 mg total) by mouth every 8 (eight) hours as needed. 08/31/23   Georjean Darice HERO, MD  topiramate  (TOPAMAX ) 100 MG tablet Take 1 tablet in AM, 1 and 1/2 tablets in PM 08/31/23   Georjean Darice HERO, MD    Allergies:  Patient has no known allergies.    Review of Systems  Neurological:  Positive for seizures.    Updated Vital Signs BP (!) 126/109 (BP Location: Left Arm)   Pulse (!) 55   Temp 98.1 F (36.7 C) (Oral)   Resp 20   Ht 5' 9 (1.753 m)   Wt 61.2 kg   SpO2 100%   BMI 19.94 kg/m   Physical Exam Vitals and nursing note reviewed.  Constitutional:      General: He is not in acute distress.    Appearance: He is well-developed.  HENT:     Head: Normocephalic and atraumatic.  Eyes:     Extraocular Movements: Extraocular movements intact.     Conjunctiva/sclera: Conjunctivae normal.     Pupils: Pupils are equal, round, and reactive to light.  Cardiovascular:     Rate and Rhythm: Normal rate and regular rhythm.  Pulmonary:     Effort: Pulmonary effort is normal. No respiratory distress.     Breath sounds: Normal breath sounds.  Abdominal:     Palpations: Abdomen is soft.     Tenderness: There is no abdominal tenderness.  Musculoskeletal:        General: No swelling.     Cervical back: Neck supple.  Skin:    General: Skin is warm and dry.     Capillary Refill: Capillary refill takes less than 2 seconds.  Neurological:  General: No focal deficit present.     Mental Status: He is alert and oriented to person, place, and time. Mental status is at baseline.  Psychiatric:        Mood and Affect: Mood normal.     (all labs ordered are listed, but only abnormal results are displayed) Labs Reviewed  I-STAT CHEM 8, ED - Abnormal; Notable for the following components:      Result Value   Calcium, Ion 1.00 (*)    TCO2 17 (*)    All other components within normal limits  CBC  CBG MONITORING, ED    EKG: None  Radiology: No results found.  {Document cardiac monitor, telemetry assessment procedure when appropriate:32947} Procedures   Medications Ordered in the ED  magnesium  sulfate IVPB 1 g 100 mL (has no administration in time range)      {Click here for ABCD2, HEART  and other calculators REFRESH Note before signing:1}                              Medical Decision Making Amount and/or Complexity of Data Reviewed Labs: ordered.  Risk Prescription drug management.   This patient presents to the ED for concern of seizure, this involves an extensive number of treatment options, and is a complaint that carries with it a high risk of complications and morbidity.  The differential diagnosis includes seizure, infection, electrolyte abnormality, dysrhythmia, intracranial abnormality, others   Co morbidities / Chronic conditions that complicate the patient evaluation  Seizure disorder, anxiety disorder, adjustment insomnia   Additional history obtained:  Additional history obtained from EMR External records from outside source obtained and reviewed including neurology notes   Lab Tests:  I Ordered, and personally interpreted labs.  The pertinent results include: Unremarkable Chem-8   Imaging Studies ordered:  Patient states that headache was not thunderclap in nature, not the worst of his life, typical with previous headaches postseizure.  I see no indication for emergent intracranial imaging   Cardiac Monitoring: / EKG:  The patient was maintained on a cardiac monitor.  I personally viewed and interpreted the cardiac monitored which showed an underlying rhythm of: Sinus rhythm   Problem List / ED Course / Critical interventions / Medication management   I ordered medication including magnesium  for headache, ativan  for seizure Reevaluation of the patient after these medicines showed that the patient improved   Social Determinants of Health:  Patient has Medicaid for his primary health insurance type, is a daily smoker   Test / Admission - Considered:  Patient with reported seizure-like activity this evening.    {Document critical care time when appropriate  Document review of labs and clinical decision tools ie CHADS2VASC2, etc   Document your independent review of radiology images and any outside records  Document your discussion with family members, caretakers and with consultants  Document social determinants of health affecting pt's care  Document your decision making why or why not admission, treatments were needed:32947:::1}   Final diagnoses:  None    ED Discharge Orders     None

## 2023-11-23 NOTE — ED Provider Notes (Incomplete)
 28 year old male with history of seizure disorder on topiramate  comes in following a witnessed seizure.  In the ED, he had an additional seizure.  ED ECG REPORT   Date: 11/23/2023  Rate: 68  Rhythm: normal sinus rhythm  QRS Axis: normal  Intervals: normal  ST/T Wave abnormalities: normal  Conduction Disutrbances:none  Narrative Interpretation: Normal ECG.  When compared with ECG of 02/04/2022, no significant changes are seen  Old EKG Reviewed: unchanged  I have personally reviewed the EKG tracing and agree with the computerized printout as noted.

## 2023-11-24 ENCOUNTER — Observation Stay (HOSPITAL_COMMUNITY)

## 2023-11-24 ENCOUNTER — Encounter (HOSPITAL_COMMUNITY): Payer: Self-pay | Admitting: Internal Medicine

## 2023-11-24 DIAGNOSIS — R519 Headache, unspecified: Secondary | ICD-10-CM | POA: Insufficient documentation

## 2023-11-24 DIAGNOSIS — G40919 Epilepsy, unspecified, intractable, without status epilepticus: Secondary | ICD-10-CM | POA: Diagnosis present

## 2023-11-24 DIAGNOSIS — R569 Unspecified convulsions: Secondary | ICD-10-CM | POA: Diagnosis not present

## 2023-11-24 LAB — MAGNESIUM: Magnesium: 2.1 mg/dL (ref 1.7–2.4)

## 2023-11-24 LAB — COMPREHENSIVE METABOLIC PANEL WITH GFR
ALT: 24 U/L (ref 0–44)
AST: 30 U/L (ref 15–41)
Albumin: 4.5 g/dL (ref 3.5–5.0)
Alkaline Phosphatase: 78 U/L (ref 38–126)
Anion gap: 10 (ref 5–15)
BUN: 12 mg/dL (ref 6–20)
CO2: 18 mmol/L — ABNORMAL LOW (ref 22–32)
Calcium: 9.2 mg/dL (ref 8.9–10.3)
Chloride: 107 mmol/L (ref 98–111)
Creatinine, Ser: 1.1 mg/dL (ref 0.61–1.24)
GFR, Estimated: >60 mL/min (ref >60)
Glucose, Bld: 110 mg/dL — ABNORMAL HIGH (ref 70–99)
Potassium: 3.6 mmol/L (ref 3.5–5.1)
Sodium: 135 mmol/L (ref 135–145)
Total Bilirubin: 0.8 mg/dL (ref 0.0–1.2)
Total Protein: 7.3 g/dL (ref 6.5–8.1)

## 2023-11-24 LAB — HIV ANTIBODY (ROUTINE TESTING W REFLEX): HIV Screen 4th Generation wRfx: NONREACTIVE

## 2023-11-24 LAB — PHOSPHORUS: Phosphorus: 1.9 mg/dL — ABNORMAL LOW (ref 2.5–4.6)

## 2023-11-24 MED ORDER — ACETAMINOPHEN 325 MG PO TABS: 650.0000 mg | ORAL_TABLET | Freq: Four times a day (QID) | ORAL | Status: DC | PRN

## 2023-11-24 MED ORDER — LACTATED RINGERS IV BOLUS
500.0000 mL | Freq: Once | INTRAVENOUS | Status: AC
  Administered 2023-11-24: 500 mL via INTRAVENOUS

## 2023-11-24 MED ORDER — ACETAMINOPHEN 650 MG RE SUPP: 650.0000 mg | Freq: Four times a day (QID) | RECTAL | Status: DC | PRN

## 2023-11-24 NOTE — H&P (Addendum)
 History and Physical    John King FMW:[id number] DOB: June 02, 1995 DOA: 11/23/2023  Patient coming from: Home.  Chief Complaint: Seizures   HPI: John King is a 28 y.o. male with history of seizures was brought to the ER after patient had a seizure at workplace.  History was obtained from patient's father has patient is mildly encephalopathic at the time of my exam after receiving medications and being postictal.  Patient's father states that patient has been having frontal headache since morning.  Has not had any recent travel or sick contacts.  Did not have any fever chills nausea vomiting.  Has been compliant with his medication Topamax .  ED Course: In the ER by being observed patient had another tonic-clonic seizure which lasted for 3 minutes and neurology was consulted and patient was given IV Ativan  and Keppra  loading was admitted for further workup.  Basic labs show nothing acute but repeat has been ordered stat.  Review of Systems: As per HPI, rest all negative.   Past Medical History:  Diagnosis Date   Seizures (HCC) 02-05-22    Past Surgical History:  Procedure Laterality Date   MOUTH SURGERY       reports that he has been smoking cigars. He has never used smokeless tobacco. He reports current alcohol use of about 7.0 standard drinks of alcohol per week. He reports current drug use. Drug: Marijuana.  No Known Allergies  Family History  Problem Relation Age of Onset   Seizures Mother        one only in HS   Cancer Mother 43       breast   Hypertension Mother    Hypertension Father    Alcohol abuse Father    Seizures Father        etoh withdrawal   CAD Other        50-60's. maternal side    Prior to Admission medications   Medication Sig Start Date End Date Taking? Authorizing Provider  hydrOXYzine  (ATARAX ) 25 MG tablet Take 1 tablet (25 mg total) by mouth at bedtime as needed. 11/23/23  Yes Logan Ubaldo NOVAK, PA-C  clindamycin  (CLINDAGEL) 1 % gel  Apply topically 2 (two) times daily. 05/03/22   Wendolyn Jenkins Jansky, MD  diazePAM , 15 MG Dose, (VALTOCO  15 MG DOSE) 2 x 7.5 MG/0.1ML LQPK Administer one spray in one nostril, other spray in other nostril (one dose) as needed for seizure. May use another dose 4 hours after if needed. 08/31/23   Georjean Darice HERO, MD  ondansetron  (ZOFRAN -ODT) 4 MG disintegrating tablet Take 1 tablet (4 mg total) by mouth every 8 (eight) hours as needed. 08/31/23   Georjean Darice HERO, MD  topiramate  (TOPAMAX ) 100 MG tablet Take 1 tablet in AM, 1 and 1/2 tablets in PM 08/31/23   Georjean Darice HERO, MD    Physical Exam: Constitutional: Moderately built and nourished. Vitals:   11/23/23 2209  BP: (!) 126/109  Pulse: (!) 55  Resp: 20  Temp: 98.1 F (36.7 C)  TempSrc: Oral  SpO2: 100%  Weight: 61.2 kg  Height: 5' 9 (1.753 m)   Eyes: Anicteric no pallor. ENMT: No discharge from the ears eyes nose or mouth. Neck: No neck rigidity no mass felt. Respiratory: No rhonchi or crepitations. Cardiovascular: S1-S2 heard. Abdomen: Soft nontender bowel sound present. Musculoskeletal: No edema. Skin: No rash. Neurologic: Patient is lethargic but easily arousable answers few questions oriented to his name.  Moving all extremities.  Pupils equal and reacting to  light. Psychiatric: Lethargic.   Labs on Admission: I have personally reviewed following labs and imaging studies  CBC: Recent Labs  Lab 11/23/23 2238 11/23/23 2241  WBC 6.7  --   HGB 14.1 15.0  HCT 43.5 44.0  MCV 92.6  --   PLT 188  --    Basic Metabolic Panel: Recent Labs  Lab 11/23/23 2241  NA 139  K 4.1  CL 109  GLUCOSE 90  BUN 13  CREATININE 1.00   GFR: Estimated Creatinine Clearance: 94.4 mL/min (by C-G formula based on SCr of 1 mg/dL). Liver Function Tests: No results for input(s): AST, ALT, ALKPHOS, BILITOT, PROT, ALBUMIN in the last 168 hours. No results for input(s): LIPASE, AMYLASE in the last 168 hours. No results for input(s):  AMMONIA in the last 168 hours. Coagulation Profile: No results for input(s): INR, PROTIME in the last 168 hours. Cardiac Enzymes: No results for input(s): CKTOTAL, CKMB, CKMBINDEX, TROPONINI in the last 168 hours. BNP (last 3 results) No results for input(s): PROBNP in the last 8760 hours. HbA1C: No results for input(s): HGBA1C in the last 72 hours. CBG: No results for input(s): GLUCAP in the last 168 hours. Lipid Profile: No results for input(s): CHOL, HDL, LDLCALC, TRIG, CHOLHDL, LDLDIRECT in the last 72 hours. Thyroid  Function Tests: No results for input(s): TSH, T4TOTAL, FREET4, T3FREE, THYROIDAB in the last 72 hours. Anemia Panel: No results for input(s): VITAMINB12, FOLATE, FERRITIN, TIBC, IRON, RETICCTPCT in the last 72 hours. Urine analysis: No results found for: COLORURINE, APPEARANCEUR, LABSPEC, PHURINE, GLUCOSEU, HGBUR, BILIRUBINUR, KETONESUR, PROTEINUR, UROBILINOGEN, NITRITE, LEUKOCYTESUR Sepsis Labs: @LABRCNTIP (procalcitonin:4,lacticidven:4) )No results found for this or any previous visit (from the past 240 hours).   Radiological Exams on Admission: No results found.    Assessment/Plan Principal Problem:   Seizure [facility]) Active Problems:   Headache   Breakthrough seizure (HCC)    Breakthrough seizures -    patient was given IV Ativan  and loading dose of Keppra .  Neurology has been consulted.  Neurologist recommended increasing the nighttime dose Topamax  from 150 mg to 200 mg.  And continue the morning 100 mg dose of Topamax .  Since patient has new headache CT head has been ordered. Headache follow CT head.  Since patient has seizures will need close monitoring further workup and more than 2 midnight stay.   DVT prophylaxis: SCDs. Code Status: Full code. Family Communication: Patient's father. Disposition Plan: Monitored bed. Consults called: Neurology. Admission status:  Observation.

## 2023-11-24 NOTE — ED Notes (Addendum)
 Pt denies headache, dizziness, chest pain, or shob. States that he is just sleepy. Mom at bedside states that the pt's BP is normally lower. MD messaged per mom's request and about BP readings.

## 2023-11-24 NOTE — ED Notes (Signed)
 3W charge called to inform pt is being transported to his room.

## 2023-11-24 NOTE — Progress Notes (Signed)
 PROGRESS NOTE    John King  FMW:[id number] DOB: 1995/09/07 DOA: 11/23/2023 PCP: Wendolyn Jenkins Jansky, MD   Brief Narrative:    28 y.o. male with history of seizures was brought to the ER after patient had a seizure at workplace. He takes topamax  100 mg in am and 150 mg at bedtime. He does admit to multiple stressors and lack of sleep for the past few days.  Assessment & Plan:  Principal Problem:   Seizure [facility]) Active Problems:   Headache   Breakthrough seizure (HCC)    Breakthrough seizures -    patient was given IV Ativan  and loading dose of Keppra .  Neurology has been consulted.   Neurologist recommended increasing the nighttime dose Topamax  from 150 mg to 200 mg and continue the morning 100 mg dose of Topamax .   I personally reviewed CT head and it didn't show any acute abnormalities. Seizure precautions  Headache: Like stress headache exacerbated by lack of sleep. No intracranial abnormalities seen on CT Head. No focal neuro deficits.  DVT prophylaxis: SCDs Start: 11/24/23 0018     Code Status: Full Code Family Communication:  Father and mother at the bedside Status is: Observation The patient remains OBS appropriate and will d/c before 2 midnights.    Subjective:  He is awake and alert. Father and mother are present at the bedside. He was diagnosed with seizure disorder in Novemebr 2023. He follows up with [facility] neurology and had an appointment in May this year. He hasn't missed any doses of topamax . He was taking 100 mg in am and 150 mg at night. He did tell me about his lack of sleep and multiple stressors in life. He stopped drinking alcohol back in May 2025. He does smoke Marijuana.  Examination:  General exam: Appears calm and comfortable  Respiratory system: Clear to auscultation. Respiratory effort normal. Cardiovascular system: S1 & S2 heard, RRR. No JVD, murmurs, rubs, gallops or clicks. No pedal edema. Gastrointestinal system: Abdomen is nondistended,  soft and nontender. No organomegaly or masses felt. Normal bowel sounds heard. Central nervous system: Alert and oriented. No focal neurological deficits. Extremities: Symmetric 5 x 5 power. Skin: No rashes, lesions or ulcers Psychiatry: Judgement and insight appear normal. Mood & affect appropriate.     Diet Orders (From admission, onward)     Start     Ordered   11/24/23 1057  Diet regular Room service appropriate? Yes; Fluid consistency: Thin  Diet effective now       Question Answer Comment  Room service appropriate? Yes   Fluid consistency: Thin      11/24/23 1056            Objective: Vitals:   11/24/23 1015 11/24/23 1032 11/24/23 1039 11/24/23 1100  BP: (!) 88/47  95/74 (!) 101/58  Pulse: (!) 54  (!) 51 (!) 52  Resp: 16  13 14   Temp:  98.4 F (36.9 C)  98.3 F (36.8 C)  TempSrc:  Oral  Oral  SpO2: 100%  99% 100%  Weight:      Height:       No intake or output data in the 24 hours ending 11/24/23 1138 Filed Weights   11/23/23 2209  Weight: 61.2 kg    Scheduled Meds:  LORazepam        topiramate   100 mg Oral Daily   topiramate   200 mg Oral Q2200   Continuous Infusions:  Nutritional status     Body mass index is 19.94 kg/m.  Data Reviewed:   CBC: Recent Labs  Lab 11/23/23 2238 11/23/23 2241  WBC 6.7  --   HGB 14.1 15.0  HCT 43.5 44.0  MCV 92.6  --   PLT 188  --    Basic Metabolic Panel: Recent Labs  Lab 11/23/23 2241 11/24/23 0238  NA 139 135  K 4.1 3.6  CL 109 107  CO2  --  18*  GLUCOSE 90 110*  BUN 13 12  CREATININE 1.00 1.10  CALCIUM  --  9.2  MG  --  2.1  PHOS  --  1.9*   GFR: Estimated Creatinine Clearance: 85.8 mL/min (by C-G formula based on SCr of 1.1 mg/dL). Liver Function Tests: Recent Labs  Lab 11/24/23 0238  AST 30  ALT 24  ALKPHOS 78  BILITOT 0.8  PROT 7.3  ALBUMIN 4.5   No results for input(s): LIPASE, AMYLASE in the last 168 hours. No results for input(s): AMMONIA in the last 168  hours. Coagulation Profile: No results for input(s): INR, PROTIME in the last 168 hours. Cardiac Enzymes: No results for input(s): CKTOTAL, CKMB, CKMBINDEX, TROPONINI in the last 168 hours. BNP (last 3 results) No results for input(s): PROBNP in the last 8760 hours. HbA1C: No results for input(s): HGBA1C in the last 72 hours. CBG: No results for input(s): GLUCAP in the last 168 hours. Lipid Profile: No results for input(s): CHOL, HDL, LDLCALC, TRIG, CHOLHDL, LDLDIRECT in the last 72 hours. Thyroid  Function Tests: No results for input(s): TSH, T4TOTAL, FREET4, T3FREE, THYROIDAB in the last 72 hours. Anemia Panel: No results for input(s): VITAMINB12, FOLATE, FERRITIN, TIBC, IRON, RETICCTPCT in the last 72 hours. Sepsis Labs: No results for input(s): PROCALCITON, LATICACIDVEN in the last 168 hours.  No results found for this or any previous visit (from the past 240 hours).       Radiology Studies: CT HEAD WO CONTRAST Result Date: 11/24/2023 CLINICAL DATA:  Status post seizure. EXAM: CT HEAD WITHOUT CONTRAST TECHNIQUE: Contiguous axial images were obtained from the base of the skull through the vertex without intravenous contrast. RADIATION DOSE REDUCTION: This exam was performed according to the departmental dose-optimization program which includes automated exposure control, adjustment of the mA and/or kV according to patient size and/or use of iterative reconstruction technique. COMPARISON:  Aug 01, 2022 FINDINGS: Brain: No evidence of acute infarction, hemorrhage, hydrocephalus, extra-axial collection or mass lesion/mass effect. Vascular: No hyperdense vessel or unexpected calcification. Skull: Normal. Negative for fracture or focal lesion. Sinuses/Orbits: No acute finding. Other: None. IMPRESSION: No acute intracranial pathology. Electronically Signed   By: Suzen Dials M.D.   On: 11/24/2023 02:09        LOS: 0 days    Time spent= 40 mins    Deliliah Room, MD [facility]  If 7PM-7AM, please contact night-coverage  11/24/2023, 11:38 AM

## 2023-11-24 NOTE — Care Management (Signed)
  Transition of Care [facility]) Screening Note   Patient Details  Name: John King Date of Birth: 10/28/1995   Transition of Care [facility]) CM/SW Contact:    Corean JAYSON Canary, RN Phone Number: 11/24/2023, 8:26 AM    Transition of Care Department [facility]) has reviewed patient and no TOC needs have been identified at this time. We will continue to monitor patient advancement through interdisciplinary progression rounds. If new patient transition needs arise, please place a TOC consult.

## 2023-11-24 NOTE — ED Notes (Signed)
 This nurse at bedside, pt resting in the bed. Denies any pain, headache, or dizziness. Pt states that he is tired and ready to go home. Family at bedside. No acute distress at this time. Family advised to call nurse with any changes.

## 2023-11-24 NOTE — ED Notes (Signed)
 CCMD called and notified

## 2023-11-24 NOTE — Plan of Care (Signed)

## 2023-11-25 DIAGNOSIS — R569 Unspecified convulsions: Secondary | ICD-10-CM | POA: Diagnosis not present

## 2023-11-25 MED ORDER — ORAL CARE MOUTH RINSE: 15.0000 mL | OROMUCOSAL | Status: DC | PRN

## 2023-11-25 MED ORDER — TOPIRAMATE 100 MG PO TABS: ORAL_TABLET | ORAL | Status: DC

## 2023-11-25 NOTE — Discharge Summary (Signed)
 Physician Discharge Summary   Patient: John King MRN: [id number] DOB: 04-13-1995  Admit date:     11/23/2023  Discharge date: 11/25/23  Discharge Physician: Deliliah Room   PCP: Wendolyn Jenkins Jansky, MD   Recommendations at discharge:    Follow up with your PCP and neurologist. Take topamax  100 mg in am and 200 mg at bedtime. Return to ED if you have seizure episodes.  Discharge Diagnoses: Principal Problem:   Seizure [facility]) Active Problems:   Headache   Breakthrough seizure [facility])    Hospital Course:  Breakthrough seizures -    patient was given IV Ativan  and loading dose of Keppra .  Neurologist recommended increasing the nighttime dose Topamax  from 150 mg to 200 mg and continue the morning 100 mg dose of Topamax .   I personally reviewed CT head and it didn't show any acute abnormalities. No episodes of seizures during admission He will follow up out patient with [facility] neurologist. He already has an appointment.   Headache: Like stress headache exacerbated by lack of sleep. No intracranial abnormalities seen on CT Head. No focal neuro deficits.  Case discussed in length with patient's father and mother at bedside on 8/28.      Consultants: Neurology Procedures performed: None  Disposition: Home Diet recommendation:  Regular diet DISCHARGE MEDICATION: Allergies as of 11/25/2023   No Known Allergies      Medication List     TAKE these medications    clindamycin  1 % gel Commonly known as: Clindagel Apply topically 2 (two) times daily.   hydrOXYzine  25 MG tablet Commonly known as: ATARAX  Take 1 tablet (25 mg total) by mouth at bedtime as needed.   ondansetron  4 MG disintegrating tablet Commonly known as: ZOFRAN -ODT Take 1 tablet (4 mg total) by mouth every 8 (eight) hours as needed.   topiramate  100 MG tablet Commonly known as: TOPAMAX  Take 1 tablet in AM, 2 tablets in PM What changed: additional instructions   Valtoco  15 MG Dose 2 x 7.5 MG/0.1ML  Lqpk Generic drug: diazePAM  (15 MG Dose) Administer one spray in one nostril, other spray in other nostril (one dose) as needed for seizure. May use another dose 4 hours after if needed.        Follow-up Information     Wendolyn Jenkins Jansky, MD .   Specialty: Family Medicine Contact information: [address] [place] [address] [phone]         [facility]. Schedule an appointment as soon as possible for a visit in 1 week(s).   Contact information: [address] [place], [address] [place]  [address] [phone]               Discharge Exam: Fredricka Weights   11/23/23 2209  Weight: 61.2 kg   Constitutional: NAD, calm, comfortable Eyes: PERRL, lids and conjunctivae normal ENMT: Mucous membranes are moist. Posterior pharynx clear of any exudate or lesions.Normal dentition.  Neck: normal, supple, no masses, no thyromegaly Respiratory: clear to auscultation bilaterally, no wheezing, no crackles. Normal respiratory effort. No accessory muscle use.  Cardiovascular: Regular rate and rhythm, no murmurs / rubs / gallops. No extremity edema. 2+ pedal pulses. No carotid bruits.  Abdomen: no tenderness, no masses palpated. No hepatosplenomegaly. Bowel sounds positive.  Musculoskeletal: no clubbing / cyanosis. No joint deformity upper and lower extremities. Good ROM, no contractures. Normal muscle tone.  Skin: no rashes, lesions, ulcers. No induration Neurologic: CN 2-12 grossly intact. Sensation intact, DTR normal. Strength 5/5 x all  4 extremities.  Psychiatric: Normal judgment and insight. Alert and oriented x 3. Normal mood.    Condition at discharge: good  The results of significant diagnostics from this hospitalization (including imaging, microbiology, ancillary and laboratory) are listed below for reference.   Imaging Studies: CT HEAD WO CONTRAST Result Date: 11/24/2023 CLINICAL DATA:  Status post seizure. EXAM: CT HEAD WITHOUT CONTRAST  TECHNIQUE: Contiguous axial images were obtained from the base of the skull through the vertex without intravenous contrast. RADIATION DOSE REDUCTION: This exam was performed according to the departmental dose-optimization program which includes automated exposure control, adjustment of the mA and/or kV according to patient size and/or use of iterative reconstruction technique. COMPARISON:  Aug 01, 2022 FINDINGS: Brain: No evidence of acute infarction, hemorrhage, hydrocephalus, extra-axial collection or mass lesion/mass effect. Vascular: No hyperdense vessel or unexpected calcification. Skull: Normal. Negative for fracture or focal lesion. Sinuses/Orbits: No acute finding. Other: None. IMPRESSION: No acute intracranial pathology. Electronically Signed   By: Suzen Dials M.D.   On: 11/24/2023 02:09    Microbiology: No results found for this or any previous visit.  Labs: CBC: Recent Labs  Lab 11/23/23 2238 11/23/23 2241  WBC 6.7  --   HGB 14.1 15.0  HCT 43.5 44.0  MCV 92.6  --   PLT 188  --    Basic Metabolic Panel: Recent Labs  Lab 11/23/23 2241 11/24/23 0238  NA 139 135  K 4.1 3.6  CL 109 107  CO2  --  18*  GLUCOSE 90 110*  BUN 13 12  CREATININE 1.00 1.10  CALCIUM  --  9.2  MG  --  2.1  PHOS  --  1.9*   Liver Function Tests: Recent Labs  Lab 11/24/23 0238  AST 30  ALT 24  ALKPHOS 78  BILITOT 0.8  PROT 7.3  ALBUMIN 4.5   CBG: No results for input(s): GLUCAP in the last 168 hours.  Discharge time spent: 41 minutes.  Signed: Deliliah Room, MD [facility] 11/25/2023

## 2023-11-25 NOTE — Plan of Care (Signed)

## 2023-12-01 ENCOUNTER — Encounter: Payer: Self-pay | Admitting: Neurology

## 2023-12-01 ENCOUNTER — Ambulatory Visit: Admitting: Neurology

## 2023-12-01 VITALS — BP 106/70 | HR 52 | Ht 70.0 in | Wt 126.0 lb

## 2023-12-01 DIAGNOSIS — G40009 Localization-related (focal) (partial) idiopathic epilepsy and epileptic syndromes with seizures of localized onset, not intractable, without status epilepticus: Secondary | ICD-10-CM

## 2023-12-01 MED ORDER — TOPIRAMATE 100 MG PO TABS: ORAL_TABLET | ORAL | 3 refills | Status: DC

## 2023-12-01 NOTE — Progress Notes (Signed)
 NEUROLOGY FOLLOW UP OFFICE NOTE  John King [id number] [birth year]  HISTORY OF PRESENT ILLNESS: I had the pleasure of seeing John King in follow-up in the neurology clinic on 12/01/2023.  The patient was last seen 3 months ago for seizures. He is alone in the office today. Records and images were personally reviewed where available.  He had a cluster of seizures in May after drinking alcohol the night prior. He had a 48-hour EEG in 09/2023 that was normal, typical events not reported. Since John last visit, he had another cluster of seizures on 11/23/23. He had woken up with a headache on 8/27 even before he went to work. No nausea/vomiting. He was able to complete John shift, and as he was about to leave, he recalls feeling a nervous feeling in John stomach, then waking up on the floor. John coworkers helped him to the ground and reported seizure lasted 2-3 minutes. No tongue bite, incontinence, focal weakness. He had nausea after the seizure. While he was in the ER, he had another GTC lasting 3 minutes and was admitted for observation. Bloodwork was normal, head CT no acute changes. Topiramate  dose increased to 100mg  in AM, 200mg  in PM. He denies any alcohol use. He thinks poor sleep and dehydration contributed to the seizure. He had not been sleeping well that week. He states he is thinking of a lot of things and felt tired. Since then, he has been staying hydrated more and trying to sleep better. No side effects on increased Topiramate  dose. No further similar headaches, he had a slight one last night that he attributes to being tired from work, it resolved after he ate and lay down. Mood is good.    History on Initial Assessment 03/24/2022: This is a 28 year old right-handed man presenting for evaluation of new onset seizures. John King is present to provide additional information. He was in John usual state of health until 02/04/22. He recalls sleeping beside John 28 year old, woke up an hour  later feeling fine and going back to sleep. He then woke up and saw the girl's King with blood around the room. The John King had called her King, he apparently got up from bed, went to the bathroom, then as he came out he fell and had a seizure. He hit the left side of John head and then went back to bed but has no recollection of this. He had bit John tongue, urinary incontinence, and required staples for the head injury. He was brought to the ER where bloodwork showed a WBC of 12, EtOH 10. Head CT no acute changes, venous sinuses were noted to be mildly hyperdense, nonspecific but can be seen in the setting of dehydration. He then had another seizure on 03/15/22. He recalls feeling tired the day prior. He was visiting a friend who told him that something was off when he got there, he went to sleep then woke up to EMS around him. He was brought to [facility], per ER notes, girlfriend woke up to patient having a seizure in bed, he bit John tongue and had nausea/vomiting. Head CT noted a vague small focus of hypoattenuation in the right frontal lobe that is favored to be artifactual. Bloodwork normal. He was discharged home on Levetiracetam  750mg  BID.He reports missing 2 doses the day before Christmas then having another nocturnal seizure on 03/22/22. He again bit John tongue. He has some drowsiness with the Levetiracetam . He has occasional body twitches in John head,  neck, and back that he attributes to bulging discs. John legs would just jump uncontrollable sometimes, twitching and trembling. After a long day, John head may twitch uncontrollably. No associated confusion. This started around are [age]. He has occasional episodes of deja vu. No staring/unresponsive episodes, focal numbness/tingling/weakness, olfactory/gustatory hallucinations. He used to have migraines but denies any more headaches. No dizziness, diplopia, dysarthria/dysphagia, bowel/bladder dysfunction. He usually gets 7-9 hours of sleep. He drinks  2-3 liquor beverages daily. He smokes marijuana. He denies any alcohol prior to the first seizure but recalls having a drink the night before the second and third seizures. He lives with John King. Mood is fine.   Epilepsy Risk Factors:  John King had one seizure and takes Topiramate . John father had 2 alcohol-related seizures. He had a normal birth and early development.  There is no history of febrile convulsions, CNS infections such as meningitis/encephalitis, significant traumatic brain injury, neurosurgical procedures, or family history of seizures.  Prior ASMs: Levetiracetam  (mood changes), Zonisamide  (waves of emotions he breaks down and cries everyday)   Diagnostic Data: MRI brain with and without contrast 08/2022 normal, hippocampi symmetric with no abnormal signal or enhancement seen 1-hour EEG in 07/2022 normal  PAST MEDICAL HISTORY: Past Medical History:  Diagnosis Date   Seizures (HCC) 02-05-22    MEDICATIONS: Current Outpatient Medications on File Prior to Visit  Medication Sig Dispense Refill   ondansetron  (ZOFRAN -ODT) 4 MG disintegrating tablet Take 1 tablet (4 mg total) by mouth every 8 (eight) hours as needed. 20 tablet 6   topiramate  (TOPAMAX ) 100 MG tablet Take 1 tablet in AM, 2 tablets in PM     clindamycin  (CLINDAGEL) 1 % gel Apply topically 2 (two) times daily. (Patient not taking: Reported on 12/01/2023) 30 g 4   diazePAM , 15 MG Dose, (VALTOCO  15 MG DOSE) 2 x 7.5 MG/0.1ML LQPK Administer one spray in one nostril, other spray in other nostril (one dose) as needed for seizure. May use another dose 4 hours after if needed. (Patient not taking: Reported on 12/01/2023) 10 each 5   hydrOXYzine  (ATARAX ) 25 MG tablet Take 1 tablet (25 mg total) by mouth at bedtime as needed. (Patient not taking: Reported on 12/01/2023) 10 tablet 0   No current facility-administered medications on file prior to visit.    ALLERGIES: No Known Allergies  FAMILY HISTORY: Family History  Problem  Relation Age of Onset   Seizures King        one only in HS   Cancer King 65       breast   Hypertension King    Hypertension Father    Alcohol abuse Father    Seizures Father        etoh withdrawal   CAD Other        50-60's. maternal side    SOCIAL HISTORY: Social History   Socioeconomic History   Marital status: Single    Spouse name: Not on file   Number of children: 0   Years of education: Not on file   Highest education level: Not on file  Occupational History   Not on file  Tobacco Use   Smoking status: Former    Types: Cigars   Smokeless tobacco: Never  Vaping Use   Vaping status: Every Day  Substance and Sexual Activity   Alcohol use: Yes    Alcohol/week: 7.0 standard drinks of alcohol    Types: 7 Shots of liquor per week    Comment: socially drink  or two on the weekends   Drug use: Yes    Types: Marijuana    Comment: Daily   Sexual activity: Yes    Partners: Female    Birth control/protection: None  Other Topics Concern   Not on file  Social History Narrative   Unem.  1 step dau.   Occ hosts parties on weekends.   Are you right handed or left handed? Right    Are you currently employed ? No    What is your current occupation?   Do you live at home alone? no   Who lives with you? With family    What type of home do you live in: 1 story or 2 story?  15 steps        Social Drivers of Corporate investment banker Strain: High Risk (06/17/2023)   Overall Financial Resource Strain (CARDIA)    Difficulty of Paying Living Expenses: Hard  Food Insecurity: No Food Insecurity (11/24/2023)   Hunger Vital Sign    Worried About Running Out of Food in the Last Year: Never true    Ran Out of Food in the Last Year: Never true  Transportation Needs: No Transportation Needs (11/24/2023)   PRAPARE - Administrator, Civil Service (Medical): No    Lack of Transportation (Non-Medical): No  Physical Activity: Inactive (06/17/2023)   Exercise Vital  Sign    Days of Exercise per Week: 0 days    Minutes of Exercise per Session: 0 min  Stress: Stress Concern Present (06/17/2023)   [employer] of Occupational Health - Occupational Stress Questionnaire    Feeling of Stress : Rather much  Social Connections: Socially Isolated (06/17/2023)   Social Connection and Isolation Panel    Frequency of Communication with Friends and Family: Never    Frequency of Social Gatherings with Friends and Family: Never    Attends Religious Services: 1 to 4 times per year    Active Member of [employer] or Organizations: No    Attends Banker Meetings: Never    Marital Status: Never married  Intimate Partner Violence: Not At Risk (11/24/2023)   Humiliation, Afraid, Rape, and Kick questionnaire    Fear of Current or Ex-Partner: No    Emotionally Abused: No    Physically Abused: No    Sexually Abused: No     PHYSICAL EXAM: Vitals:   12/01/23 1131  BP: 106/70  Pulse: (!) 52  SpO2: 97%   General: No acute distress Head:  Normocephalic/atraumatic Skin/Extremities: No rash, no edema Neurological Exam: alert and awake. No aphasia or dysarthria. Fund of knowledge is appropriate.  Attention and concentration are normal.   Cranial nerves: Pupils equal, round. Extraocular movements intact with no nystagmus. Visual fields full.  No facial asymmetry.  Motor: Bulk and tone normal, muscle strength 5/5 throughout with no pronator drift.   Finger to nose testing intact.  Gait narrow-based and steady, able to tandem walk adequately.  Romberg negative.   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 Brain MRI and 48-hour EEG normal, typical events not captured. He had another cluster of seizures on 11/23/23. He is now on Topiramate  100mg  in AM, 200mg  in PM without side effects. He has prn Valtoco  for seizure clusters. Continue working on sleep hygiene, hydration, and  alcohol avoidance. He is aware of [place] driving laws to stop driving after a seizure until 6 months  seizure-free. Follow-up in 3 months, call for any changes.   Thank you for allowing me to participate in John care.  Please do not hesitate to call for any questions or concerns.    Darice Shivers, M.D.   CC: Dr. Wendolyn

## 2023-12-01 NOTE — Patient Instructions (Addendum)
 Good to see you feeling better. Continue on increased dose of Topamax  100mg : take 1 tablet in AM, 2 tablets in PM. Let me know if you need refills for the Valtoco  rescue nasal spray. Continue working on sleep hygiene, hydration, avoiding alcohol. Follow-up in 3 months, call for any changes.   Seizure Precautions: 1. If medication has been prescribed for you to prevent seizures, take it exactly as directed.  Do not stop taking the medicine without talking to your doctor first, even if you have not had a seizure in a long time.   2. Avoid activities in which a seizure would cause danger to yourself or to others.  Don't operate dangerous machinery, swim alone, or climb in high or dangerous places, such as on ladders, roofs, or girders.  Do not drive unless your doctor says you may.  3. If you have any warning that you may have a seizure, lay down in a safe place where you can't hurt yourself.    4.  No driving for 6 months from last seizure, as per [place]  state law.   Please refer to the following link on the Epilepsy Foundation of America's website for more information: http://www.epilepsyfoundation.org/answerplace/Social/driving/drivingu.cfm   5.  Maintain good sleep hygiene.  6.  Contact your doctor if you have any problems that may be related to the medicine you are taking.  7.  Call 911 and bring the patient back to the ED if:        A.  The seizure lasts longer than 5 minutes.       B.  The patient doesn't awaken shortly after the seizure  C.  The patient has new problems such as difficulty seeing, speaking or moving  D.  The patient was injured during the seizure  E.  The patient has a temperature over 102 F (39C)  F.  The patient vomited and now is having trouble breathing

## 2024-01-02 ENCOUNTER — Telehealth: Payer: Self-pay | Admitting: Neurology

## 2024-01-02 MED ORDER — TOPIRAMATE 200 MG PO TABS: 200.0000 mg | ORAL_TABLET | Freq: Two times a day (BID) | ORAL | 3 refills | Status: DC

## 2024-01-02 NOTE — Telephone Encounter (Signed)
 I would increase Topiramate  one last time, to 200mg  twice a day. He has 100mg  tablets, if he still has a lot, increase to 2 tablets twice a day. I sent in a new prescription for Topiramate  200mg : Take 1 tablet twice a day once he gets the new bottle. If seizures continue on this dose, we will need to add a second seizure medication. Thanks

## 2024-01-02 NOTE — Telephone Encounter (Signed)
 Pt's girlfriend called in today and  stated  that pt had a seizure yesterday at 9:45p.m and another one at 3:45a.m. Pt wants to know what he needs to. Thanks

## 2024-01-02 NOTE — Telephone Encounter (Signed)
 Pt c/o: seizure Missed medications?  No. Sleep deprived?  Yes.   Alcohol intake?  No. Increased stress? No. Any change in medication color or shape? No. Back to their usual baseline self?  Yes.  . If no, advise go to ER Current medications prescribed by Dr. Georjean: topiramate 

## 2024-01-03 NOTE — Telephone Encounter (Signed)
 LVM--to pt mom to call the office back regarding medication.

## 2024-01-03 NOTE — Telephone Encounter (Signed)
 Spoke pt mother and voiced understanding. Send [facility] message for medication instruction,as requested by pt mother.

## 2024-01-04 ENCOUNTER — Emergency Department (HOSPITAL_COMMUNITY): Admission: EM | Admit: 2024-01-04 | Discharge: 2024-01-05 | Disposition: A | Discharge status: Home / Self Care

## 2024-01-04 ENCOUNTER — Encounter (HOSPITAL_COMMUNITY): Payer: Self-pay | Admitting: *Deleted

## 2024-01-04 ENCOUNTER — Other Ambulatory Visit: Payer: Self-pay

## 2024-01-04 DIAGNOSIS — L0201 Cutaneous abscess of face: Secondary | ICD-10-CM | POA: Diagnosis not present

## 2024-01-04 NOTE — ED Triage Notes (Signed)
 The pt has two large absses one on his rt jaw and a smaller one on his lt jaw.  He has had for one week he has had the same in the past

## 2024-01-05 MED ORDER — SULFAMETHOXAZOLE-TRIMETHOPRIM 800-160 MG PO TABS: 1.0000 | ORAL_TABLET | Freq: Two times a day (BID) | ORAL | 0 refills | Status: AC

## 2024-01-05 MED ORDER — NAPROXEN 500 MG PO TABS: 500.0000 mg | ORAL_TABLET | Freq: Two times a day (BID) | ORAL | 0 refills | Status: AC

## 2024-01-05 MED ORDER — SULFAMETHOXAZOLE-TRIMETHOPRIM 800-160 MG PO TABS
1.0000 | ORAL_TABLET | Freq: Once | ORAL | Status: AC
  Administered 2024-01-05: 1 via ORAL
  Filled 2024-01-05: qty 1

## 2024-01-05 MED ORDER — CEPHALEXIN 500 MG PO CAPS: 500.0000 mg | ORAL_CAPSULE | Freq: Four times a day (QID) | ORAL | 0 refills | Status: DC

## 2024-01-05 MED ORDER — CEPHALEXIN 250 MG PO CAPS
500.0000 mg | ORAL_CAPSULE | Freq: Once | ORAL | Status: AC
  Administered 2024-01-05: 500 mg via ORAL
  Filled 2024-01-05: qty 2

## 2024-01-05 MED ORDER — LIDOCAINE-EPINEPHRINE (PF) 2 %-1:200000 IJ SOLN
10.0000 mL | Freq: Once | INTRAMUSCULAR | Status: AC
  Administered 2024-01-05: 10 mL
  Filled 2024-01-05: qty 20

## 2024-01-05 NOTE — Discharge Instructions (Signed)
 Please take the 2 antibiotics as prescribed and take naproxen as needed for pain and swelling.  Use warm compresses on the area 4 times daily to help with continued drainage.  Please return to ER for difficulty breathing, swelling, fevers, or other concerning symptoms.

## 2024-01-05 NOTE — ED Provider Notes (Signed)
  EMERGENCY DEPARTMENT AT [facility] Provider Note   CSN: [id number] Arrival date & time: 01/04/24  2220     Patient presents with: abscess   John King is a 28 y.o. male.   28 year old male with past medical history of epilepsy and recurrent folliculitis and facial abscesses in the past presenting to the emergency department today with 2 spots concerning for abscess on the right and left face.  The patient states that this started about a week ago.  Is gradually worsened.  He denies any fevers.  Denies any difficulty breathing or swallowing.  He states that he has had these successfully drained in the emergency department the past.  He came to the ER for further evaluation.        Prior to Admission medications   Medication Sig Start Date End Date Taking? Authorizing Provider  cephALEXin (KEFLEX) 500 MG capsule Take 1 capsule (500 mg total) by mouth 4 (four) times daily. 01/05/24  Yes Ula Prentice SAUNDERS, MD  naproxen (NAPROSYN) 500 MG tablet Take 1 tablet (500 mg total) by mouth 2 (two) times daily. 01/05/24  Yes Ula Prentice SAUNDERS, MD  sulfamethoxazole -trimethoprim  (BACTRIM  DS) 800-160 MG tablet Take 1 tablet by mouth 2 (two) times daily for 7 days. 01/05/24 01/12/24 Yes Ula Prentice SAUNDERS, MD  clindamycin  (CLINDAGEL) 1 % gel Apply topically 2 (two) times daily. Patient not taking: Reported on 12/01/2023 05/03/22   Wendolyn Jenkins Jansky, MD  diazePAM , 15 MG Dose, (VALTOCO  15 MG DOSE) 2 x 7.5 MG/0.1ML LQPK Administer one spray in one nostril, other spray in other nostril (one dose) as needed for seizure. May use another dose 4 hours after if needed. Patient not taking: Reported on 12/01/2023 08/31/23   Georjean Darice HERO, MD  hydrOXYzine  (ATARAX ) 25 MG tablet Take 1 tablet (25 mg total) by mouth at bedtime as needed. Patient not taking: Reported on 12/01/2023 11/23/23   Logan Ubaldo NOVAK, PA-C  ondansetron  (ZOFRAN -ODT) 4 MG disintegrating tablet Take 1 tablet (4 mg total) by mouth every 8  (eight) hours as needed. 08/31/23   Georjean Darice HERO, MD  topiramate  (TOPAMAX ) 200 MG tablet Take 1 tablet (200 mg total) by mouth 2 (two) times daily. 01/02/24   Georjean Darice HERO, MD    Allergies: Patient has no known allergies.    Review of Systems  Skin:        Abscesses  All other systems reviewed and are negative.   Updated Vital Signs BP 120/67 (BP Location: Right Arm)   Pulse 61   Temp 98.1 F (36.7 C) (Oral)   Resp 16   Ht 5' 10 (1.778 m)   Wt 57.2 kg   SpO2 100%   BMI 18.08 kg/m   Physical Exam Vitals and nursing note reviewed.   Gen: NAD Eyes: PERRL, EOMI HEENT: no oropharyngeal swelling Neck: trachea midline Resp: clear to auscultation bilaterally Card: RRR, no murmurs, rubs, or gallops Abd: nontender, nondistended Extremities: no calf tenderness, no edema Vascular: 2+ radial pulses bilaterally, 2+ DP pulses bilaterally Skin: There is a large fluctuant area noted over the right angle of the mandible measuring roughly 6 cm in circumference as well as a smaller 3 cm fluctuant area over the left mandible with some mild surrounding erythema, no crepitus, well-circumscribed Psyc: acting appropriately   (all labs ordered are listed, but only abnormal results are displayed) Labs Reviewed - No data to display  EKG: None  Radiology: No results found.   .Incision and  Drainage  Date/Time: 01/05/2024 9:12 AM  Performed by: Ula Prentice SAUNDERS, MD Authorized by: Ula Prentice SAUNDERS, MD   Consent:    Consent obtained:  Verbal   Consent given by:  Patient   Risks, benefits, and alternatives were discussed: yes     Risks discussed:  Bleeding, incomplete drainage, pain, infection and damage to other organs   Alternatives discussed:  No treatment Universal protocol:    Immediately prior to procedure, a time out was called: yes     Patient identity confirmed:  Verbally with patient Location:    Type:  Abscess   Size:  6   Location:  Head   Head location:   Face Pre-procedure details:    Skin preparation:  Chlorhexidine Sedation:    Sedation type:  None Anesthesia:    Anesthesia method:  Local infiltration   Local anesthetic:  Lidocaine  2% WITH epi Procedure type:    Complexity:  Complex Procedure details:    Ultrasound guidance: no     Needle aspiration: no     Incision types:  Stab incision   Incision depth:  Subcutaneous   Wound management:  Probed and deloculated and irrigated with saline   Drainage:  Purulent   Drainage amount:  Copious   Wound treatment:  Wound left open   Packing materials:  None Post-procedure details:    Procedure completion:  Tolerated Comments:     R face .Incision and Drainage  Date/Time: 01/05/2024 9:14 AM  Performed by: Ula Prentice SAUNDERS, MD Authorized by: Ula Prentice SAUNDERS, MD   Consent:    Consent obtained:  Verbal   Consent given by:  Patient   Risks, benefits, and alternatives were discussed: yes     Risks discussed:  Bleeding, damage to other organs, infection, incomplete drainage and pain   Alternatives discussed:  No treatment Universal protocol:    Immediately prior to procedure, a time out was called: yes     Patient identity confirmed:  Verbally with patient Location:    Type:  Abscess   Size:  4   Location:  Head   Head location:  Face Pre-procedure details:    Skin preparation:  Chlorhexidine Sedation:    Sedation type:  None Anesthesia:    Anesthesia method:  Local infiltration   Local anesthetic:  Lidocaine  2% WITH epi Procedure type:    Complexity:  Simple Procedure details:    Ultrasound guidance: no     Needle aspiration: no     Incision types:  Stab incision   Incision depth:  Dermal   Wound management:  Probed and deloculated   Drainage:  Bloody   Drainage amount:  Moderate   Wound treatment:  Wound left open   Packing materials:  None Post-procedure details:    Procedure completion:  Tolerated Comments:     4 cm L facial abscess    Medications Ordered in the  ED  lidocaine -EPINEPHrine (XYLOCAINE  W/EPI) 2 %-1:200000 (PF) injection 10 mL (has no administration in time range)  cephALEXin (KEFLEX) capsule 500 mg (has no administration in time range)  sulfamethoxazole -trimethoprim  (BACTRIM  DS) 800-160 MG per tablet 1 tablet (has no administration in time range)                                    Medical Decision Making 28 year old male presenting to the emergency department today with bilateral facial abscesses.  These areas are very well-circumscribed and  do appear to have been secondary to folliculitis which the patient has been dealing with for quite some time.  He has had these successfully drained in the emergency department in the past.  Will go forward with incision and drainage here and start the patient on antibiotics.  His vital signs are stable and again suspicion for deep space soft tissue infection is low at this time.  The patient's abscesses were incised and drained.  The right facial abscess had extensive loculations and a copious amount of drainage while the left facial abscess was relatively small with moderate purulent drainage with minimal loculations.  The patient tolerated this procedure well  Risk Prescription drug management.        Final diagnoses:  Facial abscess    ED Discharge Orders          Ordered    sulfamethoxazole -trimethoprim  (BACTRIM  DS) 800-160 MG tablet  2 times daily        01/05/24 0917    cephALEXin (KEFLEX) 500 MG capsule  4 times daily        01/05/24 0917    naproxen (NAPROSYN) 500 MG tablet  2 times daily        01/05/24 0917               Ula Prentice SAUNDERS, MD 01/05/24 [phone]

## 2024-02-06 ENCOUNTER — Emergency Department (HOSPITAL_COMMUNITY)

## 2024-02-06 ENCOUNTER — Emergency Department (HOSPITAL_COMMUNITY)
Admission: EM | Admit: 2024-02-06 | Discharge: 2024-02-06 | Disposition: A | Discharge status: Home / Self Care | Attending: Emergency Medicine | Admitting: Emergency Medicine

## 2024-02-06 DIAGNOSIS — R410 Disorientation, unspecified: Secondary | ICD-10-CM | POA: Diagnosis not present

## 2024-02-06 DIAGNOSIS — R531 Weakness: Secondary | ICD-10-CM | POA: Diagnosis not present

## 2024-02-06 DIAGNOSIS — G40909 Epilepsy, unspecified, not intractable, without status epilepticus: Secondary | ICD-10-CM | POA: Diagnosis not present

## 2024-02-06 DIAGNOSIS — S0990XA Unspecified injury of head, initial encounter: Secondary | ICD-10-CM | POA: Diagnosis not present

## 2024-02-06 DIAGNOSIS — R569 Unspecified convulsions: Secondary | ICD-10-CM | POA: Diagnosis not present

## 2024-02-06 LAB — CBC WITH DIFFERENTIAL/PLATELET
Abs Immature Granulocytes: 0.09 K/uL — ABNORMAL HIGH (ref 0.00–0.07)
Basophils Absolute: 0 K/uL (ref 0.0–0.1)
Basophils Relative: 0 %
Eosinophils Absolute: 0 K/uL (ref 0.0–0.5)
Eosinophils Relative: 0 %
HCT: 44.1 % (ref 39.0–52.0)
Hemoglobin: 15 g/dL (ref 13.0–17.0)
Immature Granulocytes: 1 %
Lymphocytes Relative: 4 %
Lymphs Abs: 0.5 K/uL — ABNORMAL LOW (ref 0.7–4.0)
MCH: 30.4 pg (ref 26.0–34.0)
MCHC: 34 g/dL (ref 30.0–36.0)
MCV: 89.3 fL (ref 80.0–100.0)
Monocytes Absolute: 0.8 K/uL (ref 0.1–1.0)
Monocytes Relative: 7 %
Neutro Abs: 10.4 K/uL — ABNORMAL HIGH (ref 1.7–7.7)
Neutrophils Relative %: 88 %
Platelets: 229 K/uL (ref 150–400)
RBC: 4.94 MIL/uL (ref 4.22–5.81)
RDW: 13.9 % (ref 11.5–15.5)
WBC: 11.9 K/uL — ABNORMAL HIGH (ref 4.0–10.5)
nRBC: 0 % (ref 0.0–0.2)

## 2024-02-06 LAB — BASIC METABOLIC PANEL WITH GFR
Anion gap: 14 (ref 5–15)
BUN: 13 mg/dL (ref 6–20)
CO2: 17 mmol/L — ABNORMAL LOW (ref 22–32)
Calcium: 9.5 mg/dL (ref 8.9–10.3)
Chloride: 109 mmol/L (ref 98–111)
Creatinine, Ser: 1.22 mg/dL (ref 0.61–1.24)
GFR, Estimated: >60 mL/min (ref >60)
Glucose, Bld: 105 mg/dL — ABNORMAL HIGH (ref 70–99)
Potassium: 3.8 mmol/L (ref 3.5–5.1)
Sodium: 140 mmol/L (ref 135–145)

## 2024-02-06 LAB — CBG MONITORING, ED: Glucose-Capillary: 118 mg/dL — ABNORMAL HIGH (ref 70–99)

## 2024-02-06 LAB — MAGNESIUM: Magnesium: 3.2 mg/dL — ABNORMAL HIGH (ref 1.7–2.4)

## 2024-02-06 MED ORDER — CHLORDIAZEPOXIDE HCL 25 MG PO CAPS
100.0000 mg | ORAL_CAPSULE | Freq: Once | ORAL | Status: AC
  Administered 2024-02-06: 100 mg via ORAL
  Filled 2024-02-06: qty 4

## 2024-02-06 MED ORDER — BUTALBITAL-APAP-CAFFEINE 50-325-40 MG PO TABS
1.0000 | ORAL_TABLET | Freq: Once | ORAL | Status: AC
  Administered 2024-02-06: 1 via ORAL
  Filled 2024-02-06: qty 1

## 2024-02-06 NOTE — ED Triage Notes (Signed)
 BIB by EMS from home. Per EMS, pt. Family reports he had 3 seizures in  a 4 hour time frame. Patient was confused & disoriented upon EMS arrival. Currently lethargic C/O headache, fatigue and dehydration.

## 2024-02-06 NOTE — ED Provider Notes (Signed)
 [place] EMERGENCY DEPARTMENT AT [facility] Provider Note   CSN: [id number] Arrival date & time: 02/06/24  8191     Patient presents with: Seizures   John King is a 28 y.o. male.   28 yo M with a cc of seizures patient tells me that he has had multiple seizures today.  He thinks he had about 4.  He is not really sure.  He said at 1 point he hit his head.  He thinks he has been taking his Topamax  as prescribed.  Denies any recent change to his dose.  He stopped drinking.  He thinks this was some months ago.  He denies other injury.  Denies cough congestion or fever.  Denies abdominal pain.   Seizures      Prior to Admission medications   Medication Sig Start Date End Date Taking? Authorizing Provider  cephALEXin (KEFLEX) 500 MG capsule Take 1 capsule (500 mg total) by mouth 4 (four) times daily. 01/05/24   Ula Prentice SAUNDERS, MD  clindamycin  (CLINDAGEL) 1 % gel Apply topically 2 (two) times daily. Patient not taking: Reported on 12/01/2023 05/03/22   Wendolyn Jenkins Jansky, MD  diazePAM , 15 MG Dose, (VALTOCO  15 MG DOSE) 2 x 7.5 MG/0.1ML LQPK Administer one spray in one nostril, other spray in other nostril (one dose) as needed for seizure. May use another dose 4 hours after if needed. Patient not taking: Reported on 12/01/2023 08/31/23   Georjean Darice HERO, MD  hydrOXYzine  (ATARAX ) 25 MG tablet Take 1 tablet (25 mg total) by mouth at bedtime as needed. Patient not taking: Reported on 12/01/2023 11/23/23   Logan Ubaldo NOVAK, PA-C  naproxen (NAPROSYN) 500 MG tablet Take 1 tablet (500 mg total) by mouth 2 (two) times daily. 01/05/24   Ula Prentice SAUNDERS, MD  ondansetron  (ZOFRAN -ODT) 4 MG disintegrating tablet Take 1 tablet (4 mg total) by mouth every 8 (eight) hours as needed. 08/31/23   Georjean Darice HERO, MD  topiramate  (TOPAMAX ) 200 MG tablet Take 1 tablet (200 mg total) by mouth 2 (two) times daily. 01/02/24   Georjean Darice HERO, MD    Allergies: Patient has no known allergies.    Review of  Systems  Neurological:  Positive for seizures.    Updated Vital Signs BP 108/60 (BP Location: Left Arm)   Pulse 86   Temp 98.6 F (37 C) (Oral)   Resp 17   Ht 5' 10 (1.778 m)   Wt 57.2 kg   SpO2 99%   BMI 18.09 kg/m   Physical Exam Vitals and nursing note reviewed.  Constitutional:      Appearance: He is well-developed.  HENT:     Head: Normocephalic and atraumatic.  Eyes:     Pupils: Pupils are equal, round, and reactive to light.  Neck:     Vascular: No JVD.  Cardiovascular:     Rate and Rhythm: Normal rate and regular rhythm.     Heart sounds: No murmur heard.    No friction rub. No gallop.  Pulmonary:     Effort: No respiratory distress.     Breath sounds: No wheezing.  Abdominal:     General: There is no distension.     Tenderness: There is no abdominal tenderness. There is no guarding or rebound.  Musculoskeletal:        General: Normal range of motion.     Cervical back: Normal range of motion and neck supple.  Skin:    Coloration: Skin is not  pale.     Findings: No rash.  Neurological:     Mental Status: He is alert.     Comments: Seems a bit confused.  Follows commands without issue.  Psychiatric:        Behavior: Behavior normal.     (all labs ordered are listed, but only abnormal results are displayed) Labs Reviewed  CBC WITH DIFFERENTIAL/PLATELET - Abnormal; Notable for the following components:      Result Value   WBC 11.9 (*)    Neutro Abs 10.4 (*)    Lymphs Abs 0.5 (*)    Abs Immature Granulocytes 0.09 (*)    All other components within normal limits  BASIC METABOLIC PANEL WITH GFR - Abnormal; Notable for the following components:   CO2 17 (*)    Glucose, Bld 105 (*)    All other components within normal limits  MAGNESIUM  - Abnormal; Notable for the following components:   Magnesium  3.2 (*)    All other components within normal limits  CBG MONITORING, ED - Abnormal; Notable for the following components:   Glucose-Capillary 118 (*)     All other components within normal limits    EKG: None  Radiology: CT Head Wo Contrast Result Date: 02/06/2024 EXAM: CT HEAD WITHOUT CONTRAST 02/06/2024 08:14:25 PM TECHNIQUE: CT of the head was performed without the administration of intravenous contrast. Automated exposure control, iterative reconstruction, and/or weight based adjustment of the mA/kV was utilized to reduce the radiation dose to as low as reasonably achievable. COMPARISON: 11/24/2023 CLINICAL HISTORY: Head trauma, abnormal mental status (Age 101-64y) FINDINGS: BRAIN AND VENTRICLES: No acute hemorrhage. No evidence of acute infarct. No hydrocephalus. No extra-axial collection. No mass effect or midline shift. ORBITS: No acute abnormality. SINUSES: No acute abnormality. SOFT TISSUES AND SKULL: No acute soft tissue abnormality. No skull fracture. IMPRESSION: 1. No acute intracranial abnormality. Electronically signed by: Pinkie Pebbles MD 02/06/2024 08:18 PM EST RP Workstation: [id number]     Procedures   Medications Ordered in the ED  chlordiazePOXIDE (LIBRIUM) capsule 100 mg (100 mg Oral Given 02/06/24 1854)                                    Medical Decision Making Amount and/or Complexity of Data Reviewed Labs: ordered. Radiology: ordered.  Risk Prescription drug management.   28 yo M with a chief complaint of multiple seizures.  He thinks hemaybe had four today.  States compliance with his medicines.  Seems a bit confused.  Maybe is postictal but also struck his head.  Will obtain CT imaging.  Blood work.  Observe in the ED.  On record review patient had a cluster of seizure activity and was admitted to the hospital.  His neurologist thought this was may be due to heavy alcohol use.  Patient tells me that he has stopped drinking but he is not really sure how long ago he stopped.  He is pretty sure it was not recently.  Blood work without significant electrolyte abnormalities.  Metabolic acidosis likely due to  prior seizure.  Leukocytosis with neutrophilic predominance.  CT of the head without acute intracranial pathology.  Patient feeling better on repeat assessment.  Discussed results with him.  He would like to go home at this time.  Neurology follow-up.  9:13 PM:  I have discussed the diagnosis/risks/treatment options with the patient and family.  Evaluation and diagnostic testing in the emergency department does  not suggest an emergent condition requiring admission or immediate intervention beyond what has been performed at this time.  They will follow up with PCP, neuro. We also discussed returning to the ED immediately if new or worsening sx occur. We discussed the sx which are most concerning (e.g., sudden worsening pain, fever, inability to tolerate by mouth, repeat seizure without return to baseline) that necessitate immediate return. Medications administered to the patient during their visit and any new prescriptions provided to the patient are listed below.  Medications given during this visit Medications  chlordiazePOXIDE (LIBRIUM) capsule 100 mg (100 mg Oral Given 02/06/24 1854)     The patient appears reasonably screen and/or stabilized for discharge and I doubt any other medical condition or other [facility] requiring further screening, evaluation, or treatment in the ED at this time prior to discharge.       Final diagnoses:  Seizure disorder [facility])    ED Discharge Orders          Ordered    Ambulatory referral to Neurology       Comments: Breakthrough seizure   02/06/24 2103               Emil Share, DO 02/06/24 2113

## 2024-02-06 NOTE — Discharge Instructions (Addendum)
 Follow up with your neurologist in the office. Return for repeat seizure and you dont return to normal.

## 2024-02-08 ENCOUNTER — Telehealth: Payer: Self-pay | Admitting: Neurology

## 2024-02-08 NOTE — Telephone Encounter (Signed)
 Pt c/o: seizure Missed medications?  No. Sleep deprived?  No. Alcohol intake?  No. Increased stress? Yes.   A little  Trigger ?  No  Any change in medication color or shape? No. Back to their usual baseline self?  Yes.  . If no, advise go to ER Current medications prescribed by Dr. Georjean: topiramate  200mg  2 times daily  Valtoco  nasal spray   Pt has an appointment for tomorrow

## 2024-02-08 NOTE — Telephone Encounter (Signed)
 Pt LMOM letting us  know he had multiple seizures and went to ED 02/06/24

## 2024-02-08 NOTE — Telephone Encounter (Signed)
 Dessie called back an lvm: returning call from the office.  FYI: Called back appt is now scheduled.

## 2024-02-08 NOTE — Telephone Encounter (Signed)
 Tried calling patient back, No answer. LMOVM to call the office back.

## 2024-02-08 NOTE — Telephone Encounter (Signed)
 Front staff had called him yesterday to get him in for earlier appointment but had to leave a message. I have an opening tomorrow at 8:30 and Friday at 2:30pm if he can come in one of those days, thanks

## 2024-02-09 ENCOUNTER — Encounter: Payer: Self-pay | Admitting: Neurology

## 2024-02-09 ENCOUNTER — Ambulatory Visit: Admitting: Neurology

## 2024-02-09 VITALS — BP 99/64 | HR 68 | Ht 69.0 in | Wt 131.6 lb

## 2024-02-09 DIAGNOSIS — G40009 Localization-related (focal) (partial) idiopathic epilepsy and epileptic syndromes with seizures of localized onset, not intractable, without status epilepticus: Secondary | ICD-10-CM

## 2024-02-09 MED ORDER — TOPIRAMATE 200 MG PO TABS: 200.0000 mg | ORAL_TABLET | Freq: Two times a day (BID) | ORAL | 3 refills | Status: DC

## 2024-02-09 MED ORDER — XCOPRI 14 X 12.5 MG & 14 X 25 MG PO TBPK: ORAL_TABLET | ORAL | 0 refills | Status: DC

## 2024-02-09 MED ORDER — VALTOCO 15 MG DOSE 2 X 7.5 MG/0.1ML NA LQPK: NASAL | 5 refills | Status: DC

## 2024-02-09 MED ORDER — XCOPRI 50 MG PO TABS: ORAL_TABLET | ORAL | 5 refills | Status: DC

## 2024-02-09 NOTE — Progress Notes (Signed)
 NEUROLOGY FOLLOW UP OFFICE NOTE  John King [id number] Aug 08, 1995  HISTORY OF PRESENT ILLNESS: I had the pleasure of seeing John King in follow-up in the neurology clinic on 02/09/2024.  The patient was last seen 2 months ago for seizures. He is alone in the office today. Records and images were personally reviewed where available.  Since his last visit, he has had 2 more clusters of seizures. He had 2 in 6 hours in October. His girlfriend contacted our office that he had a seizure 10/5 at 9:45pm, then another on 10/6 at 3:45am. When he went to work the next day, he felt like he was daydreaming a lot, he works as a financial risk analyst and could talk and understand, he just felt he was in his own world. Topiramate  was increased to 200mg  BID. He was in the ER on 02/06/24 after having 3-4 seizures. He felt fine going to bed the night prior and thinks he woke up, then his father told him he had 3 seizures. He was in and out and recalls throwing up at home after the seizure, then waking up in the ambulance. No tongue bite, incontinence, focal weakness. Head CT no acute changes. He has a bad headache and his toes are always tingling for 30-60 minutes after the seizures. He denies any alcohol use. 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 However, both of the seizure clusters had occurred on a Monday, raising concern that the weekend sleep disruption is potentially contributing. Mood is good. No side effects on Topiramate .    History on Initial Assessment 03/24/2022: This is a 28 year old right-handed man presenting for evaluation of new onset seizures. His mother is present to provide additional information. He was in his usual state of health until 02/04/22. He recalls sleeping beside his 29 year old, woke up an hour later feeling fine and going back to sleep. He then woke up and saw the girl's  mother with blood around the room. The child had called her mother, he apparently got up from bed, went to the bathroom, then as he came out he fell and had a seizure. He hit the left side of his head and then went back to bed but has no recollection of this. He had bit his tongue, urinary incontinence, and required staples for the head injury. He was brought to the ER where bloodwork showed a WBC of 12, EtOH 10. Head CT no acute changes, venous sinuses were noted to be mildly hyperdense, nonspecific but can be seen in the setting of dehydration. He then had another seizure on 03/15/22. He recalls feeling tired the day prior. He was visiting a friend who told him that something was off when he got there, he went to sleep then woke up to EMS around him. He was brought to [facility], per ER notes, girlfriend woke up to patient having a seizure in bed, he bit his tongue and had nausea/vomiting. Head CT noted a vague small focus of hypoattenuation in the right frontal lobe that is favored to be artifactual. Bloodwork normal. He was discharged home on Levetiracetam  750mg  BID.He reports missing 2 doses the day before Christmas then having another nocturnal seizure on 03/22/22. He again bit his tongue. He has some drowsiness with the Levetiracetam . He has occasional body twitches in his head, neck, and back that he  attributes to bulging discs. His legs would just jump uncontrollable sometimes, twitching and trembling. After a long day, his head may twitch uncontrollably. No associated confusion. This started around are 22 or 23. He has occasional episodes of deja vu. No staring/unresponsive episodes, focal numbness/tingling/weakness, olfactory/gustatory hallucinations. He used to have migraines but denies any more headaches. No dizziness, diplopia, dysarthria/dysphagia, bowel/bladder dysfunction. He usually gets 7-9 hours of sleep. He drinks 2-3 liquor beverages daily. He smokes marijuana. He denies any alcohol prior to  the first seizure but recalls having a drink the night before the second and third seizures. He lives with his mother. Mood is fine.   Epilepsy Risk Factors:  His mother had one seizure and takes Topiramate . HIs father had 2 alcohol-related seizures. He had a normal birth and early development.  There is no history of febrile convulsions, CNS infections such as meningitis/encephalitis, significant traumatic brain injury, neurosurgical procedures, or family history of seizures.  Prior ASMs: Levetiracetam  (mood changes), Zonisamide  (waves of emotions he breaks down and cries everyday)   Diagnostic Data: MRI brain with and without contrast 08/2022 normal, hippocampi symmetric with no abnormal signal or enhancement seen 1-hour EEG in 07/2022 normal  PAST MEDICAL HISTORY: Past Medical History:  Diagnosis Date   Seizures (HCC) 02-05-22    MEDICATIONS: Outpatient Encounter Medications as of 02/09/2024  Medication Sig Note             naproxen (NAPROSYN) 500 MG tablet Take 1 tablet (500 mg total) by mouth 2 (two) times daily.    ondansetron  (ZOFRAN -ODT) 4 MG disintegrating tablet Take 1 tablet (4 mg total) by mouth every 8 (eight) hours as needed.              diazePAM , 15 MG Dose, (VALTOCO  15 MG DOSE) 2 x 7.5 MG/0.1ML LQPK Administer one spray in one nostril, other spray in other nostril (one dose) as needed for seizure. May use another dose 4 hours after if needed.    topiramate  (TOPAMAX ) 200 MG tablet Take 1 tablet (200 mg total) by mouth 2 (two) times daily.    No facility-administered encounter medications on file as of 02/09/2024.    ALLERGIES: No Known Allergies  FAMILY HISTORY: Family History  Problem Relation Age of Onset   Seizures Mother        one only in HS   Cancer Mother 40       breast   Hypertension Mother    Hypertension Father    Alcohol abuse Father    Seizures Father        etoh withdrawal   CAD Other        50-60's. maternal side    SOCIAL  HISTORY: Social History   Socioeconomic History   Marital status: Single    Spouse name: Not on file   Number of children: 0   Years of education: Not on file   Highest education level: Not on file  Occupational History   Not on file  Tobacco Use   Smoking status: Former    Types: Cigars   Smokeless tobacco: Never  Vaping Use   Vaping status: Every Day  Substance and Sexual Activity   Alcohol use: Yes    Alcohol/week: 7.0 standard drinks of alcohol    Types: 7 Shots of liquor per week    Comment: socially drink or two on the weekends   Drug use: Yes    Types: Marijuana    Comment: Daily   Sexual activity: Yes  Partners: Female    Birth control/protection: None  Other Topics Concern   Not on file  Social History Narrative   Unem.  1 step dau.   Occ hosts parties on weekends.   Are you right handed or left handed? Right    Are you currently employed ? No    What is your current occupation?   Do you live at home alone? no   Who lives with you? With family    What type of home do you live in: 1 story or 2 story?  15 steps        Social Drivers of Corporate Investment Banker Strain: High Risk (06/17/2023)   Overall Financial Resource Strain (CARDIA)    Difficulty of Paying Living Expenses: Hard  Food Insecurity: No Food Insecurity (11/24/2023)   Hunger Vital Sign    Worried About Running Out of Food in the Last Year: Never true    Ran Out of Food in the Last Year: Never true  Transportation Needs: No Transportation Needs (11/24/2023)   PRAPARE - Administrator, Civil Service (Medical): No    Lack of Transportation (Non-Medical): No  Physical Activity: Inactive (06/17/2023)   Exercise Vital Sign    Days of Exercise per Week: 0 days    Minutes of Exercise per Session: 0 min  Stress: Stress Concern Present (06/17/2023)   [employer] of Occupational Health - Occupational Stress Questionnaire    Feeling of Stress : Rather much  Social Connections:  Socially Isolated (06/17/2023)   Social Connection and Isolation Panel    Frequency of Communication with Friends and Family: Never    Frequency of Social Gatherings with Friends and Family: Never    Attends Religious Services: 1 to 4 times per year    Active Member of [employer] or Organizations: No    Attends Banker Meetings: Never    Marital Status: Never married  Intimate Partner Violence: Not At Risk (11/24/2023)   Humiliation, Afraid, Rape, and Kick questionnaire    Fear of Current or Ex-Partner: No    Emotionally Abused: No    Physically Abused: No    Sexually Abused: No     PHYSICAL EXAM: Vitals:   02/09/24 0842  BP: 99/64  Pulse: 68  SpO2: 100%   General: No acute distress Head:  Normocephalic/atraumatic Skin/Extremities: No rash, no edema Neurological Exam: alert and awake. No aphasia or dysarthria. Fund of knowledge is appropriate.  Attention and concentration are normal.   Cranial nerves: Pupils equal, round. Extraocular movements intact with no nystagmus. Visual fields full.  No facial asymmetry.  Motor: Bulk and tone normal, muscle strength 5/5 throughout with no pronator drift.   Finger to nose testing intact.  Gait narrow-based and steady, able to tandem walk adequately.  Romberg negative.   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 Brain MRI and 48-hour EEG normal, typical events not captured. He continues to have recurrent seizures occurring in clusters, most recently on 02/06/24. On review of his sleep schedule, both clusters have occurred on Mondays (after weekend of working until 5am). This may be contributing. We discussed adding on Xcopri, side effects discussed. Start Xcopri 12.5mg  at bedtime for 2 weeks, then 25mg  at bedtime for 2 weeks, then 50mg  at bedtime. Continue Topiramate  200mg  BID. Refills sent for prn [place]  for seizure rescue. He has tried different  seizure medications with continued seizures,  he will be referred for EMU monitoring for characterization. He is aware of [place] driving laws to stop driving after a seizure until 6 months seizure-free. Follow-up in 6 weeks, call for any changes.   Thank you for allowing me to participate in his care.  Please do not hesitate to call for any questions or concerns.    Darice Shivers, M.D.   CC: Dr. Wendolyn

## 2024-02-09 NOTE — Patient Instructions (Signed)
 Good to see you.  Start Xcopri starter pack: take 12.5mg  every night for 2 weeks, then 25mg  every night for 2 weeks. After this, start the Xcopri 50mg : take 1 tablet every night  2. Continue Topiramate  200mg  twice a day  3. Referral will be sent for inpatient video EEG study  4. Follow-up in 6 weeks, call for any changes   Seizure Precautions: 1. If medication has been prescribed for you to prevent seizures, take it exactly as directed.  Do not stop taking the medicine without talking to your doctor first, even if you have not had a seizure in a long time.   2. Avoid activities in which a seizure would cause danger to yourself or to others.  Don't operate dangerous machinery, swim alone, or climb in high or dangerous places, such as on ladders, roofs, or girders.  Do not drive unless your doctor says you may.  3. If you have any warning that you may have a seizure, lay down in a safe place where you can't hurt yourself.    4.  No driving for 6 months from last seizure, as per [place]  state law.   Please refer to the following link on the Epilepsy Foundation of America's website for more information: http://www.epilepsyfoundation.org/answerplace/Social/driving/drivingu.cfm   5.  Maintain good sleep hygiene. Avoid alcohol.  6.  Contact your doctor if you have any problems that may be related to the medicine you are taking.  7.  Call 911 and bring the patient back to the ED if:        A.  The seizure lasts longer than 5 minutes.       B.  The patient doesn't awaken shortly after the seizure  C.  The patient has new problems such as difficulty seeing, speaking or moving  D.  The patient was injured during the seizure  E.  The patient has a temperature over 102 F (39C)  F.  The patient vomited and now is having trouble breathing

## 2024-02-09 NOTE — Telephone Encounter (Signed)
Seen 11/13

## 2024-03-20 ENCOUNTER — Ambulatory Visit: Admitting: Neurology

## 2024-04-09 ENCOUNTER — Inpatient Hospital Stay (HOSPITAL_COMMUNITY)
Admission: RE | Admit: 2024-04-09 | Discharge: 2024-04-12 | DRG: 101 | Disposition: A | Discharge status: Home / Self Care | Attending: Neurology | Admitting: Neurology

## 2024-04-09 ENCOUNTER — Inpatient Hospital Stay (HOSPITAL_COMMUNITY)

## 2024-04-09 ENCOUNTER — Encounter (HOSPITAL_COMMUNITY)

## 2024-04-09 DIAGNOSIS — R569 Unspecified convulsions: Principal | ICD-10-CM

## 2024-04-09 DIAGNOSIS — F1290 Cannabis use, unspecified, uncomplicated: Secondary | ICD-10-CM | POA: Diagnosis not present

## 2024-04-09 DIAGNOSIS — Z820 Family history of epilepsy and other diseases of the nervous system: Secondary | ICD-10-CM

## 2024-04-09 DIAGNOSIS — G40109 Localization-related (focal) (partial) symptomatic epilepsy and epileptic syndromes with simple partial seizures, not intractable, without status epilepticus: Principal | ICD-10-CM | POA: Diagnosis present

## 2024-04-09 DIAGNOSIS — Z79899 Other long term (current) drug therapy: Secondary | ICD-10-CM | POA: Diagnosis not present

## 2024-04-09 DIAGNOSIS — Z8249 Family history of ischemic heart disease and other diseases of the circulatory system: Secondary | ICD-10-CM

## 2024-04-09 DIAGNOSIS — Z87891 Personal history of nicotine dependence: Secondary | ICD-10-CM | POA: Diagnosis not present

## 2024-04-09 DIAGNOSIS — F1210 Cannabis abuse, uncomplicated: Secondary | ICD-10-CM | POA: Diagnosis present

## 2024-04-09 LAB — CBC WITH DIFFERENTIAL/PLATELET
Abs Immature Granulocytes: 0.02 K/uL (ref 0.00–0.07)
Basophils Absolute: 0.1 K/uL (ref 0.0–0.1)
Basophils Relative: 2 %
Eosinophils Absolute: 0.1 K/uL (ref 0.0–0.5)
Eosinophils Relative: 2 %
HCT: 40 % (ref 39.0–52.0)
Hemoglobin: 13.4 g/dL (ref 13.0–17.0)
Immature Granulocytes: 1 %
Lymphocytes Relative: 38 %
Lymphs Abs: 1.3 K/uL (ref 0.7–4.0)
MCH: 30 pg (ref 26.0–34.0)
MCHC: 33.5 g/dL (ref 30.0–36.0)
MCV: 89.7 fL (ref 80.0–100.0)
Monocytes Absolute: 0.4 K/uL (ref 0.1–1.0)
Monocytes Relative: 13 %
Neutro Abs: 1.5 K/uL — ABNORMAL LOW (ref 1.7–7.7)
Neutrophils Relative %: 44 %
Platelets: 241 K/uL (ref 150–400)
RBC: 4.46 MIL/uL (ref 4.22–5.81)
RDW: 14.3 % (ref 11.5–15.5)
WBC: 3.3 K/uL — ABNORMAL LOW (ref 4.0–10.5)
nRBC: 0 % (ref 0.0–0.2)

## 2024-04-09 LAB — URINE DRUG SCREEN
Amphetamines: NEGATIVE
Barbiturates: NEGATIVE
Benzodiazepines: NEGATIVE
Cocaine: NEGATIVE
Fentanyl: NEGATIVE
Methadone Scn, Ur: NEGATIVE
Opiates: NEGATIVE
Tetrahydrocannabinol: POSITIVE — AB

## 2024-04-09 LAB — COMPREHENSIVE METABOLIC PANEL WITH GFR
ALT: 11 U/L (ref 0–44)
AST: 18 U/L (ref 15–41)
Albumin: 4.2 g/dL (ref 3.5–5.0)
Alkaline Phosphatase: 94 U/L (ref 38–126)
Anion gap: 10 (ref 5–15)
BUN: 14 mg/dL (ref 6–20)
CO2: 18 mmol/L — ABNORMAL LOW (ref 22–32)
Calcium: 8.9 mg/dL (ref 8.9–10.3)
Chloride: 111 mmol/L (ref 98–111)
Creatinine, Ser: 0.95 mg/dL (ref 0.61–1.24)
GFR, Estimated: >60 mL/min
Glucose, Bld: 124 mg/dL — ABNORMAL HIGH (ref 70–99)
Potassium: 3.7 mmol/L (ref 3.5–5.1)
Sodium: 139 mmol/L (ref 135–145)
Total Bilirubin: 0.3 mg/dL (ref 0.0–1.2)
Total Protein: 6.9 g/dL (ref 6.5–8.1)

## 2024-04-09 LAB — PHOSPHORUS: Phosphorus: 2.6 mg/dL (ref 2.5–4.6)

## 2024-04-09 LAB — GLUCOSE, CAPILLARY: Glucose-Capillary: 90 mg/dL (ref 70–99)

## 2024-04-09 LAB — MAGNESIUM: Magnesium: 2 mg/dL (ref 1.7–2.4)

## 2024-04-09 LAB — PROTIME-INR
INR: 1 (ref 0.8–1.2)
Prothrombin Time: 13.5 s (ref 11.4–15.2)

## 2024-04-09 MED ORDER — TOPIRAMATE 100 MG PO TABS
200.0000 mg | ORAL_TABLET | Freq: Two times a day (BID) | ORAL | Status: AC
  Administered 2024-04-09: 200 mg via ORAL
  Filled 2024-04-09: qty 2

## 2024-04-09 MED ORDER — CENOBAMATE 50 MG PO TABS
25.0000 mg | ORAL_TABLET | Freq: Every day | ORAL | Status: AC
  Administered 2024-04-09: 25 mg via ORAL
  Filled 2024-04-09 (×2): qty 1

## 2024-04-09 MED ORDER — ENOXAPARIN SODIUM 40 MG/0.4ML IJ SOSY
40.0000 mg | PREFILLED_SYRINGE | INTRAMUSCULAR | Status: DC
  Administered 2024-04-09 – 2024-04-12 (×4): 40 mg via SUBCUTANEOUS
  Filled 2024-04-09 (×4): qty 0.4

## 2024-04-09 MED ORDER — SODIUM CHLORIDE 0.9% FLUSH
3.0000 mL | Freq: Two times a day (BID) | INTRAVENOUS | Status: DC
  Administered 2024-04-09 – 2024-04-12 (×7): 3 mL via INTRAVENOUS

## 2024-04-09 MED ORDER — MIDAZOLAM HCL (PF) 2 MG/2ML IJ SOLN: 2.0000 mg | INTRAMUSCULAR | Status: DC | PRN

## 2024-04-09 MED ORDER — LABETALOL HCL 5 MG/ML IV SOLN: 5.0000 mg | INTRAVENOUS | Status: DC | PRN

## 2024-04-09 NOTE — Progress Notes (Signed)
 vLTM setup  All impedances below 10k  [facility] monitoring  Patient instructed in use of patient event button

## 2024-04-09 NOTE — H&P (Signed)
 CC: Seizure-like episodes  History is obtained from: Patient, chart review  HPI: John King is a 29 y.o. male with past medical history of seizures was admitted to epilepsy monitoring unit for characterization of seizure-like episodes.  Patient reports his first seizure was in November 2023.  Reportedly he was asleep and had a generalized tonic-clonic seizure associated with tongue bite and urinary incontinence.  Workup was essentially unremarkable except alcohol level was elevated at 10.  He then had another seizure in December 2023 again out of sleep, generalized tonic-clonic seizure he was then started on Keppra .  However since then has continued to have generalized tonic-clonic seizures, sometimes goes for months without any seizure.  Last seizure was in November when he was started on Xcopri  after another breakthrough seizure  Epilepsy risk factors: Reports his mother had 1 seizure and is on Topamax .  Reports father had alcohol-related seizures.  Reports normal birth and development, denies history of febrile seizures, meningitis/encephalitis, head injury.  Prior antiseizure medications: Keppra  (caused mood change).  Zonisamide  (irritability)  Current antiseizure medication: Topamax  and was started on Xcopri  in November.  Routine EEG 08/13/2022: Normal  Ambulatory EEG 09/01/2023: Normal   ROS: All other systems reviewed and negative except as noted in the HPI.   Past Medical History:  Diagnosis Date   Seizures (HCC) 02-05-22    Family History  Problem Relation Age of Onset   Seizures Mother        one only in HS   Cancer Mother 93       breast   Hypertension Mother    Hypertension Father    Alcohol abuse Father    Seizures Father        etoh withdrawal   CAD Other        50-60's. maternal side    Social History:  reports that he has quit smoking. His smoking use included cigars. He has never used smokeless tobacco. He reports current alcohol use of about 7.0 standard  drinks of alcohol per week. He reports current drug use. Drug: Marijuana.  Medications Prior to Admission  Medication Sig Dispense Refill Last Dose/Taking   Cenobamate  (XCOPRI ) 50 MG TABS After finishing starter pack, start Xcopri  50mg  every night (Patient taking differently: Take 50 mg by mouth at bedtime.) 30 tablet 5 04/08/2024   diazePAM , 15 MG Dose, (VALTOCO  15 MG DOSE) 2 x 7.5 MG/0.1ML LQPK Administer one spray in one nostril, other spray in other nostril (one dose) as needed for seizure. May use another dose 4 hours after if needed. 10 each 5 Taking   naproxen  (NAPROSYN ) 500 MG tablet Take 1 tablet (500 mg total) by mouth 2 (two) times daily. (Patient taking differently: Take 500 mg by mouth daily as needed for mild pain (pain score 1-3) or moderate pain (pain score 4-6).) 30 tablet 0 Unknown   ondansetron  (ZOFRAN -ODT) 4 MG disintegrating tablet Take 1 tablet (4 mg total) by mouth every 8 (eight) hours as needed. (Patient taking differently: Take 4 mg by mouth every 8 (eight) hours as needed for nausea or vomiting.) 20 tablet 6 Past Month   topiramate  (TOPAMAX ) 200 MG tablet Take 1 tablet (200 mg total) by mouth 2 (two) times daily. (Patient taking differently: Take 400 mg by mouth 2 (two) times daily.) 180 tablet 3 04/09/2024 Morning   Cenobamate  (XCOPRI ) 14 x 12.5 MG & 14 x 25 MG TBPK Take starter pack as instructed. After finishing starter pack, start 50mg  prescription (Patient not taking: Reported on  04/09/2024) 1 each 0 Not Taking      Exam: Current vital signs: BP 120/70 (BP Location: Right Arm)   Pulse 66   Temp 98 F (36.7 C) (Oral)   Resp 16   SpO2 100%  Vital signs in last 24 hours: Temp:  [98 F (36.7 C)] 98 F (36.7 C) (01/12 0922) Pulse Rate:  [66] 66 (01/12 0922) Resp:  [16] 16 (01/12 0922) BP: (120)/(70) 120/70 (01/12 0922) SpO2:  [100 %] 100 % (01/12 9077)   Physical Exam  Constitutional: Appears well-developed and well-nourished.  Psych: Affect appropriate to  situation Neuro: AO x 3, no aphasia, cranial nerves grossly intact, normal strength in all 4 extremities  I have reviewed labs in epic and the results pertinent to this consultation are: CBC: No results for input(s): WBC, NEUTROABS, HGB, HCT, MCV, PLT in the last 168 hours.  Basic Metabolic Panel:  Lab Results  Component Value Date   NA 140 02/06/2024   K 3.8 02/06/2024   CO2 17 (L) 02/06/2024   GLUCOSE 105 (H) 02/06/2024   BUN 13 02/06/2024   CREATININE 1.22 02/06/2024   CALCIUM 9.5 02/06/2024   GFRNONAA >60 02/06/2024   Lipid Panel: No results found for: LDLCALC HgbA1c: No results found for: HGBA1C Urine Drug Screen: No results found for: LABOPIA, COCAINSCRNUR, LABBENZ, AMPHETMU, THCU, LABBARB  Alcohol Level     Component Value Date/Time   ETH 10 (H) 02/04/2022 1600     I have reviewed the images obtained:  CT head without contrast 02/06/2024: No acute abnormality Send MRI brain with and without contrast 09/17/2022: Unremarkable MRI appearance of the brain. No evidence of an acute intracranial abnormality. No seizure focus is identified.   ASSESSMENT/PLAN: 29 year old male with generalized tonic-clonic seizure-like episodes admitted for characterization of spells.  Seizure-like episodes - Start video EEG monitoring for characterization of spells - Will give half dose of Topamax  and Xcopri  tonight with most likely plan to stop tomorrow morning - Will most likely plan for hyperventilation, photic stimulation and sleep deprivation tomorrow depending on EEG findings - Continue seizure precautions As needed IV Versed  for clinical seizures-   Shadrack Brummitt [facility]  neurohospitalist

## 2024-04-10 ENCOUNTER — Inpatient Hospital Stay (HOSPITAL_COMMUNITY)

## 2024-04-10 DIAGNOSIS — F1290 Cannabis use, unspecified, uncomplicated: Secondary | ICD-10-CM | POA: Diagnosis not present

## 2024-04-10 DIAGNOSIS — R569 Unspecified convulsions: Secondary | ICD-10-CM | POA: Diagnosis not present

## 2024-04-10 NOTE — Procedures (Signed)
 Patient Name: John King  MRN: [id number]  Epilepsy Attending: Arlin MALVA Krebs  Referring Physician/Provider: Krebs Arlin MALVA, [place]  Duration: 04/09/2024 1058 to 04/10/2024 1058  Patient history: 29 y.o. male with past medical history of seizures was admitted to epilepsy monitoring unit for characterization of seizure-like episodes. EEG to evaluate for seizure  Level of alertness: Awake, asleep  AEDs during EEG study: None  Technical aspects: This EEG study was done with scalp electrodes positioned according to the 10-20 International system of electrode placement. Electrical activity was reviewed with band pass filter of 1-70Hz , sensitivity of 7 uV/mm, display speed of 64mm/sec with a 60Hz  notched filter applied as appropriate. EEG data were recorded continuously and digitally stored.  Video monitoring was available and reviewed as appropriate.  Description: The posterior dominant rhythm consists of 10 Hz activity of moderate voltage (25-35 uV) seen predominantly in posterior head regions, symmetric and reactive to eye opening and eye closing. Sleep was characterized by vertex waves, sleep spindles (12 to 14 Hz), maximal frontocentral region.  Hyperventilation did not show any EEG change.  Physiologic photic driving was seen during photic stimulation.    IMPRESSION: This study is within normal limits. No seizures or epileptiform discharges were seen throughout the recording. Dr. was notified.  A normal interictal EEG does not exclude the diagnosis of epilepsy.   Norris Brumbach O Andrue Dini

## 2024-04-10 NOTE — Progress Notes (Signed)
 EMU LTM maint complete - no skin breakdown under: fp1,fp2,f7. No events to report over night. Activations complete

## 2024-04-10 NOTE — TOC Initial Note (Signed)
 Transition of Care [facility]) - Initial/Assessment Note    Patient Details  Name: John King: [id number] Date of Birth: [birth year]  [facility]) CM/SW Contact:    Andrez JULIANNA George, RN Phone Number: 04/10/2024, 12:36 PM  Clinical Narrative:                 John King is a 29 y.o. male with past medical history of seizures was admitted to epilepsy monitoring unit for characterization of seizure-like episodes.   Pt is from home with his parents. Mom assists with transportation or he uses [place]. He is wanting to be able to drive again due to $$ spent on transport.  He manages his own meds.  Admitted to EMU.  Pt has transport home at d/c.   Expected Discharge Plan: Home/Self Care Barriers to Discharge: Continued Medical Work up   Patient Goals and CMS Choice            Expected Discharge Plan and Services       Living arrangements for the past 2 months: Single Family Home                                      Prior Living Arrangements/Services Living arrangements for the past 2 months: Single Family Home Lives with:: Parents Patient language and need for interpreter reviewed:: Yes Do you feel safe going back to the place where you live?: Yes        Care giver support system in place?: Yes (comment)   Criminal Activity/Legal Involvement Pertinent to Current Situation/Hospitalization: No - Comment as needed  Activities of Daily Living      Permission Sought/Granted                  Emotional Assessment Appearance:: Appears stated age Attitude/Demeanor/Rapport: Engaged Affect (typically observed): Accepting Orientation: : Oriented to Self, Oriented to Place, Oriented to  Time, Oriented to Situation   Psych Involvement: No (comment)  Admission diagnosis:  Seizure [facility]) [R56.9] Patient Active Problem List   Diagnosis Date Noted   Seizure (HCC) 11/24/2023   Headache 11/24/2023   Breakthrough seizure (HCC) 11/24/2023    Adjustment insomnia 03/01/2023   Moderate episode of recurrent major depressive disorder (HCC) 12/17/2022   Generalized anxiety disorder 12/17/2022   Folliculitis barbae 08/04/2022   Seizure disorder (HCC) 05/03/2022   History of cold sores 05/03/2022   Cough 04/07/2016   PCP:  Wendolyn Jenkins Jansky, MD Pharmacy:   [facility] [phone] [address], [place] - [address] [place] [address] Phone: [phone] Fax: [phone]     Social Drivers of Health (SDOH) Social History: SDOH Screenings   Food Insecurity: No Food Insecurity (11/24/2023)  Housing: Low Risk (11/24/2023)  Transportation Needs: No Transportation Needs (11/24/2023)  Utilities: Not At Risk (11/24/2023)  Alcohol Screen: Low Risk (06/17/2023)  Depression (PHQ2-9): Low Risk (08/29/2023)  Recent Concern: Depression (PHQ2-9) - Medium Risk (07/20/2023)  Financial Resource Strain: High Risk (06/17/2023)  Physical Activity: Inactive (06/17/2023)  Social Connections: Socially Isolated (06/17/2023)  Stress: Stress Concern Present (06/17/2023)  Tobacco Use: Medium Risk (02/09/2024)  Health Literacy: Adequate Health Literacy (06/17/2023)   SDOH Interventions:     Readmission Risk Interventions     No data to display

## 2024-04-10 NOTE — Progress Notes (Signed)
 Subjective: No acute events overnight.  No new concerns.  Getting into arguments is one of the triggers for seizures.  Denies any other triggers.  ROS: negative except above  Examination  Vital signs in last 24 hours: Temp:  [97.9 F (36.6 C)-98.7 F (37.1 C)] 98.3 F (36.8 C) (01/13 1156) Pulse Rate:  [51-71] 59 (01/13 1156) Resp:  [15-17] 15 (01/13 1156) BP: (100-118)/(47-65) 118/60 (01/13 1156) SpO2:  [99 %-100 %] 100 % (01/13 1156)  General: lying in bed, NAD  Neuro: MS: Alert, oriented, follows commands CN: pupils equal and reactive,  EOMI, face symmetric, tongue midline, normal sensation over face, Motor: 5/5 strength in all 4 extremities Coordination: normal Gait: not tested  Basic Metabolic Panel: Recent Labs  Lab 04/09/24 1218  NA 139  K 3.7  CL 111  CO2 18*  GLUCOSE 124*  BUN 14  CREATININE 0.95  CALCIUM 8.9  MG 2.0  PHOS 2.6    CBC: Recent Labs  Lab 04/09/24 1218  WBC 3.3*  NEUTROABS 1.5*  HGB 13.4  HCT 40.0  MCV 89.7  PLT 241     Coagulation Studies: Recent Labs    04/09/24 1218  LABPROT 13.5  INR 1.0    Imaging No new brain imaging overnight    ASSESSMENT AND PLAN: 29 year old male with generalized tonic-clonic seizure-like episodes admitted for characterization of spells.   Seizure-like episodes - Continue video EEG monitoring for characterization of spells - Hold all antiseizure medications today - Hyperventilation, photic stimulation and sleep deprivation today - Discussed overnight EEG findings - Continue seizure precautions -As needed IV Versed  for clinical seizures  Cannabis use disorder - Counsel against cannabis use     I personally spent a total of 36 minutes in the care of the patient today including getting/reviewing separately obtained history, performing a medically appropriate exam/evaluation, counseling and educating, placing orders, referring and communicating with other health care professionals,  documenting clinical information in the EHR, independently interpreting results, and coordinating care.           Arlin Krebs [facility] For questions after 5pm please refer to AMION to reach the Neurologist on call

## 2024-04-11 ENCOUNTER — Inpatient Hospital Stay (HOSPITAL_COMMUNITY)

## 2024-04-11 DIAGNOSIS — R569 Unspecified convulsions: Secondary | ICD-10-CM | POA: Diagnosis not present

## 2024-04-11 NOTE — Plan of Care (Signed)
" °  Problem: Clinical Measurements: Goal: Ability to maintain clinical measurements within normal limits will improve Outcome: Progressing Goal: Will remain free from infection Outcome: Progressing   Problem: Activity: Goal: Risk for activity intolerance will decrease Outcome: Progressing   Problem: Nutrition: Goal: Adequate nutrition will be maintained Outcome: Progressing   Problem: Elimination: Goal: Will not experience complications related to bowel motility Outcome: Progressing Goal: Will not experience complications related to urinary retention Outcome: Progressing   Problem: Skin Integrity: Goal: Risk for impaired skin integrity will decrease Outcome: Progressing   Problem: Education: Goal: Expressions of having a comfortable level of knowledge regarding the disease process will increase Outcome: Progressing   Problem: Coping: Goal: Ability to adjust to condition or change in health will improve Outcome: Progressing   Problem: Medication: Goal: Risk for medication side effects will decrease Outcome: Progressing   Problem: Clinical Measurements: Goal: Complications related to the disease process, condition or treatment will be avoided or minimized Outcome: Progressing Goal: Diagnostic test results will improve Outcome: Progressing   Problem: Self-Concept: Goal: Level of anxiety will decrease Outcome: Progressing Goal: Ability to verbalize feelings about condition will improve Outcome: Progressing   "

## 2024-04-11 NOTE — Progress Notes (Signed)
 Subjective: No acute events overnight.  No seizure-like episodes.  ROS: negative except above Examination  Vital signs in last 24 hours: Temp:  [97.8 F (36.6 C)-99.3 F (37.4 C)] 98.1 F (36.7 C) (01/14 0810) Pulse Rate:  [50-127] 70 (01/14 0810) Resp:  [15-20] 16 (01/14 0810) BP: (100-123)/(55-78) 101/59 (01/14 0810) SpO2:  [99 %-100 %] 100 % (01/14 0810)  General: lying in bed, NAD   Neuro: MS: Alert, oriented, follows commands CN: pupils equal and reactive,  EOMI, face symmetric, tongue midline, normal sensation over face, Motor: 5/5 strength in all 4 extremities Coordination: normal Gait: not tested  Basic Metabolic Panel: Recent Labs  Lab 04/09/24 1218  NA 139  K 3.7  CL 111  CO2 18*  GLUCOSE 124*  BUN 14  CREATININE 0.95  CALCIUM 8.9  MG 2.0  PHOS 2.6    CBC: Recent Labs  Lab 04/09/24 1218  WBC 3.3*  NEUTROABS 1.5*  HGB 13.4  HCT 40.0  MCV 89.7  PLT 241     Coagulation Studies: Recent Labs    04/09/24 1218  LABPROT 13.5  INR 1.0    Imaging No new brain imaging overnight       ASSESSMENT AND PLAN: 29 year old male with generalized tonic-clonic seizure-like episodes admitted for characterization of spells.   Seizure-like episodes - Continue video EEG monitoring for characterization of spells - Hold all antiseizure medications today - Continue sleep deprivation - Discussed overnight EEG findings - Continue seizure precautions -As needed IV Versed  for clinical seizures     I personally spent a total of 26 minutes in the care of the patient today including getting/reviewing separately obtained history, performing a medically appropriate exam/evaluation, counseling and educating, placing orders, referring and communicating with other health care professionals, documenting clinical information in the EHR, independently interpreting results, and coordinating care.         Arlin Krebs [facility] For questions  after 5pm please refer to AMION to reach the Neurologist on call

## 2024-04-11 NOTE — Progress Notes (Signed)
 vLTM maintenance  All impedances below 10k  No skin breakdown noted at  FP1  FP1  A1 A2

## 2024-04-11 NOTE — Procedures (Addendum)
 Patient Name: John King  MRN: [id number]  Epilepsy Attending: Arlin MALVA Krebs  Referring Physician/Provider: Krebs Arlin MALVA, [place]  Duration: 04/10/2024 1058 to 04/11/2024 1220   Patient history: 29 y.o. male with past medical history of seizures was admitted to epilepsy monitoring unit for characterization of seizure-like episodes. EEG to evaluate for seizure   Level of alertness: Awake, asleep   AEDs during EEG study: None   Technical aspects: This EEG study was done with scalp electrodes positioned according to the 10-20 International system of electrode placement. Electrical activity was reviewed with band pass filter of 1-70Hz , sensitivity of 7 uV/mm, display speed of 27mm/sec with a 60Hz  notched filter applied as appropriate. EEG data were recorded continuously and digitally stored.  Video monitoring was available and reviewed as appropriate.   Description: The posterior dominant rhythm consists of 10 Hz activity of moderate voltage (25-35 uV) seen predominantly in posterior head regions, symmetric and reactive to eye opening and eye closing. Sleep was characterized by vertex waves, sleep spindles (12 to 14 Hz), maximal fronto-central region.    EEG was disconnected between 04/12/2023 0730 to 0849 due to technical difficulty.   IMPRESSION: This study is within normal limits. No seizures or epileptiform discharges were seen throughout the recording.   A normal interictal EEG does not exclude the diagnosis of epilepsy.  Tinisha Etzkorn O Khila Papp

## 2024-04-11 NOTE — Progress Notes (Signed)
 Pt was able to stay awake till about 0400 and slept thereafter, no event observed. Obasogie-Asidi, Deryl Giroux Efe

## 2024-04-11 NOTE — Plan of Care (Signed)
   Problem: Coping: Goal: Level of anxiety will decrease Outcome: Progressing

## 2024-04-12 ENCOUNTER — Other Ambulatory Visit (HOSPITAL_COMMUNITY): Payer: Self-pay

## 2024-04-12 ENCOUNTER — Inpatient Hospital Stay (HOSPITAL_COMMUNITY)

## 2024-04-12 DIAGNOSIS — G40109 Localization-related (focal) (partial) symptomatic epilepsy and epileptic syndromes with simple partial seizures, not intractable, without status epilepticus: Secondary | ICD-10-CM

## 2024-04-12 MED ORDER — TOPIRAMATE 100 MG PO TABS
200.0000 mg | ORAL_TABLET | ORAL | Status: AC
  Administered 2024-04-12: 200 mg via ORAL

## 2024-04-12 MED ORDER — TOPIRAMATE 100 MG PO TABS
400.0000 mg | ORAL_TABLET | ORAL | Status: DC
  Filled 2024-04-12: qty 4

## 2024-04-12 MED ORDER — CENOBAMATE 200 MG PO TABS
100.0000 mg | ORAL_TABLET | Freq: Once | ORAL | Status: AC
  Administered 2024-04-12: 100 mg via ORAL
  Filled 2024-04-12 (×2): qty 1

## 2024-04-12 MED ORDER — CENOBAMATE 100 MG PO TABS
100.0000 mg | ORAL_TABLET | Freq: Every day | ORAL | 2 refills | Status: DC
  Filled 2024-04-12: qty 30, 30d supply, fill #0

## 2024-04-12 NOTE — Discharge Instructions (Signed)
 You were admitted to epilepsy monitoring unit between 04/09/2024 to 04/12/2024.  During this time, he underwent continuous video EEG monitoring.  He antiseizure medications were held.  Hyperventilation, photic stimulation and sleep deprivation were performed.  1 seizure without clinical signs was reported on 1/50/2026 arising from right posterior temporal region.  Topamax  200 mg twice daily was resumed.  Xcopri  was increased to 100 mg nightly.  Please continue seizure precautions and follow-up with Dr. Georjean.

## 2024-04-12 NOTE — Progress Notes (Signed)
 LTM maint complete - no skin breakdown under: Fp1 or F7

## 2024-04-12 NOTE — Progress Notes (Signed)
 John King to be discharged home per MD order. Discussed with the patient and all questions fully answered. Stored medication returned to patient. TOC meds to be picked up from pharmacy. Skin clean, dry, and intact without evidence of skin break down. IV catheter discontinued intact. Site without signs and symptoms of complications. Dressing and pressure applied.  An After Visit Summary was printed and given to the patient.  Patient escorted via WC. [name]  04/12/2024

## 2024-04-12 NOTE — Progress Notes (Signed)
 LTM EEG discontinued - no skin breakdown at [facility].

## 2024-04-12 NOTE — Procedures (Addendum)
 Patient Name: John King  MRN: [id number]  Epilepsy Attending: Arlin MALVA Krebs  Referring Physician/Provider: Krebs Arlin [place]VA, [place]  Duration: 04/11/2024 1220 to 04/12/2024 1103   Patient history: 29 y.o. male with past medical history of seizures was admitted to epilepsy monitoring unit for characterization of seizure-like episodes. EEG to evaluate for seizure   Level of alertness: Awake, asleep   AEDs during EEG study: None   Technical aspects: This EEG study was done with scalp electrodes positioned according to the 10-20 International system of electrode placement. Electrical activity was reviewed with band pass filter of 1-70Hz , sensitivity of 7 uV/mm, display speed of 33mm/sec with a 60Hz  notched filter applied as appropriate. EEG data were recorded continuously and digitally stored.  Video monitoring was available and reviewed as appropriate.   Description: The posterior dominant rhythm consists of 10 Hz activity of moderate voltage (25-35 uV) seen predominantly in posterior head regions, symmetric and reactive to eye opening and eye closing. Sleep was characterized by vertex waves, sleep spindles (12 to 14 Hz), maximal fronto-central region.  Spikes were noted in right posterior temporal region.  One seizure was noted on 04/12/2024 at 0455.  No clinical signs were noted.  EEG showed 5 to 6 Hz theta slowing in right posterior temporal region which then evolved into 3 to 5 Hz theta-delta slowing admixed with polyspikes and involved all of right hemisphere as well as vertex region.  Duration of seizure was about 1 minute 30 seconds.  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  One seizure without clinical signs was noted on 04/12/2024 at 0455, arising from right posterior temporal region, lasting for about  1 minute 30 seconds.            Nasiya Pascual O Tasman Zapata

## 2024-04-12 NOTE — Discharge Summary (Addendum)
 Physician Discharge Summary  Patient ID: John King MRN: [id number] DOB/AGE: 09/14/1995 28 y.o.  Admit date: 04/09/2024 Discharge date: 04/12/2024  Admission Diagnoses: Seizure  Discharge Diagnoses: Focal epilepsy, right temporal  Discharged Condition: stable  Hospital Course: John King was admitted to epilepsy monitoring unit between 04/09/2024 to 1/50/2026.  During this time, he underwent continuous video EEG monitoring.  Antiseizure medications were held.  Hyperventilation, photic stimulation and sleep deprivation were performed.  1 seizure without clinical signs was recorded on 04/12/2024 has improved right posterior temporal region lasting about 1.5 minutes.  Please review of the report for details.  Diagnosis of focal epilepsy was discussed with patient.  Topiramate  20 mg twice daily was resumed.  Xcopri  was increased to 100 mg nightly.  Seizure precaution including no driving were discussed.  Please continue to follow-up with Dr. Georjean  Consults: None  Significant Diagnostic Studies: Video EEG  Description: The posterior dominant rhythm consists of 10 Hz activity of moderate voltage (25-35 uV) seen predominantly in posterior head regions, symmetric and reactive to eye opening and eye closing. Sleep was characterized by vertex waves, sleep spindles (12 to 14 Hz), maximal fronto-central region.  Spikes were noted in right posterior temporal region.   One seizure was noted on 04/12/2024 at 0455.  No clinical signs were noted.  EEG showed 5 to 6 Hz theta slowing in right posterior temporal region which then evolved into 3 to 5 Hz theta-delta slowing admixed with polyspikes and involved all of right hemisphere as well as vertex region.  Duration of seizure was about 1 minute 30 seconds.   ABNORMALITY - Spike, right posterior temporal region - Seizure without clinical signs, right posterior temporal region   IMPRESSION: This study showed evidence of focal epilepsy arising from  right posterior temporal region.   One seizure without clinical signs was noted on 04/12/2024 at 0455, arising from right posterior temporal region, lasting for about 1 minute 30 seconds.                John King   Treatments: Topamax  200 mg twice daily, Xcopri  100 mg nightly  Discharge Exam: Blood pressure 108/62, pulse (!) 59, temperature 98.3 F (36.8 C), temperature source Oral, resp. rate 18, SpO2 99%. General: lying in bed, NAD   Neuro: MS: Alert, oriented, follows commands CN: pupils equal and reactive,  EOMI, face symmetric, tongue midline, normal sensation over face, Motor: 5/5 strength in all 4 extremities Coordination: normal Gait: not tested  Discharge disposition: 01-Home or Self Care   Discharge Instructions     Call MD for:   Complete by: As directed    If patient has another seizure, call 911 and bring them back to the ED if: A.  The seizure lasts longer than 5 minutes.      B.  The patient doesn't wake shortly after the seizure or has new problems such as difficulty seeing, speaking or moving following the seizure C.  The patient was injured during the seizure D.  The patient has a temperature over 102 F (39C) E.  The patient vomited during the seizure and now is having trouble breathing      Discharge instructions   Complete by: As directed    During the Seizure   - First, ensure adequate ventilation and place patients on the floor on their left side  Loosen clothing around the neck and ensure the airway is patent. If the patient is clenching the teeth, do not force the mouth  open with any object as this can cause severe damage - Remove all items from the surrounding that can be hazardous. The patient may be oblivious to what's happening and may not even know what he or she is doing. If the patient is confused and wandering, either gently guide him/her away and block access to outside areas - Reassure the individual and be comforting - Call  911. In most cases, the seizure ends before EMS arrives. However, there are cases when seizures may last over 3 to 5 minutes. Or the individual may have developed breathing difficulties or severe injuries. If a pregnant patient or a person with diabetes develops a seizure, it is prudent to call an ambulance. om the area    After the Seizure (Postictal Stage)   After a seizure, most patients experience confusion, fatigue, muscle pain and/or a headache. Thus, one should permit the individual to sleep. For the next few days, reassurance is essential. Being calm and helping reorient the person is also of importance.   Most seizures are painless and end spontaneously. Seizures are not harmful to others but can lead to complications such as stress on the lungs, brain and the heart. Individuals with prior lung problems may develop labored breathing and respiratory distress.        Increase activity slowly   Complete by: As directed    Other Restrictions   Complete by: As directed    Seizure precautions: Per [place]  DMV statutes, patients with seizures are not allowed to drive until they have been seizure-free for six months and cleared by a physician    Use caution when using heavy equipment or power tools. Avoid working on ladders or at heights. Take showers instead of baths. Ensure the water temperature is not too high on the home water heater. Do not go swimming alone. Do not lock yourself in a room alone (i.e. bathroom). When caring for infants or small children, sit down when holding, feeding, or changing them to minimize risk of injury to the child in the event you have a seizure. Maintain good sleep hygiene. Avoid alcohol.      Allergies as of 04/12/2024   No Known Allergies      Medication List     TAKE these medications    Cenobamate  100 MG Tabs Take 1 tablet (100 mg total) by mouth at bedtime. What changed:  medication strength how much to take how to take this when to  take this additional instructions Another medication with the same name was removed. Continue taking this medication, and follow the directions you see here.   naproxen  500 MG tablet Commonly known as: NAPROSYN  Take 1 tablet (500 mg total) by mouth 2 (two) times daily. What changed:  when to take this reasons to take this   ondansetron  4 MG disintegrating tablet Commonly known as: ZOFRAN -ODT Take 1 tablet (4 mg total) by mouth every 8 (eight) hours as needed. What changed: reasons to take this   topiramate  200 MG tablet Commonly known as: TOPAMAX  Take 1 tablet (200 mg total) by mouth 2 (two) times daily. What changed: how much to take   Valtoco  15 MG Dose 2 x 7.5 MG/0.1ML Lqpk Generic drug: diazePAM  (15 MG Dose) Administer one spray in one nostril, other spray in other nostril (one dose) as needed for seizure. May use another dose 4 hours after if needed.         I personally spent a total of 40 minutes in the care  of the patient today including getting/reviewing separately obtained history, performing a medically appropriate exam/evaluation, counseling and educating, placing orders, referring and communicating with other health care professionals, documenting clinical information in the EHR, independently interpreting results, and coordinating care.         Signed: Keriann Rankin O Launi King 04/12/2024, 11:38 AM

## 2024-04-12 NOTE — TOC Transition Note (Signed)
 Transition of Care [facility]) - Discharge Note   Patient Details  Name: John King MRN: [id number] Date of Birth: 12-31-1995  [facility]) CM/SW Contact:  Andrez JULIANNA George, RN Phone Number: 04/12/2024, 12:13 PM   Clinical Narrative:     Pt is discharging home with self care. No further needs per IP care management.   Final next level of care: Home/Self Care Barriers to Discharge: No Barriers Identified   Patient Goals and CMS Choice            Discharge Placement                       Discharge Plan and Services Additional resources added to the After Visit Summary for                                       Social Drivers of Health (SDOH) Interventions SDOH Screenings   Food Insecurity: No Food Insecurity (11/24/2023)  Housing: Low Risk (11/24/2023)  Transportation Needs: No Transportation Needs (11/24/2023)  Utilities: Not At Risk (11/24/2023)  Alcohol Screen: Low Risk (06/17/2023)  Depression (PHQ2-9): Low Risk (08/29/2023)  Recent Concern: Depression (PHQ2-9) - Medium Risk (07/20/2023)  Financial Resource Strain: High Risk (06/17/2023)  Physical Activity: Inactive (06/17/2023)  Social Connections: Socially Isolated (06/17/2023)  Stress: Stress Concern Present (06/17/2023)  Tobacco Use: Medium Risk (02/09/2024)  Health Literacy: Adequate Health Literacy (06/17/2023)     Readmission Risk Interventions     No data to display

## 2024-04-16 ENCOUNTER — Encounter: Payer: Self-pay | Admitting: Neurology

## 2024-04-27 ENCOUNTER — Telehealth: Payer: Self-pay

## 2024-04-27 DIAGNOSIS — G40909 Epilepsy, unspecified, not intractable, without status epilepticus: Secondary | ICD-10-CM

## 2024-04-27 NOTE — Progress Notes (Signed)
 Complex Care Management Note Care Guide Note  04/27/2024 Name: John King MRN: [id number] DOB: [birth year]   Complex Care Management Outreach Attempts: An unsuccessful telephone outreach was attempted today to offer the patient information about available complex care management services.  Follow Up Plan:  Additional outreach attempts will be made to offer the patient complex care management information and services.   Encounter Outcome:  No Answer  [name]  [facility], [facility] V[facility] Assistant Direct Dial: [phone]  Fax: [phone]

## 2024-04-27 NOTE — Progress Notes (Signed)
 Complex Care Management Note  Care Guide Note 04/27/2024 Name: John King MRN: [id number] DOB: 1995/11/15  [name] is a 29 y.o. year old male who sees Wendolyn Jenkins Jansky, MD for primary care. I reached out to Lonny C Bobak by phone today to offer complex care management services.  Mr. Pickup was given information about Complex Care Management services today including:   The Complex Care Management services include support from the care team which includes your Nurse Care Manager, Clinical Social Worker, or Pharmacist.  The Complex Care Management team is here to help remove barriers to the health concerns and goals most important to you. Complex Care Management services are voluntary, and the patient may decline or stop services at any time by request to their care team member.   Complex Care Management Consent Status: Patient did not agree to participate in complex care management services at this time.  Follow up plan:  Patient will follow up with PCP.  Encounter Outcome:  Patient Refused  Dreama Agent [facility], [facility] VBCI Assistant Direct Dial: [phone]  Fax: [phone]

## 2024-05-03 ENCOUNTER — Encounter: Payer: Self-pay | Admitting: Neurology

## 2024-05-03 ENCOUNTER — Ambulatory Visit: Payer: Self-pay | Admitting: Neurology

## 2024-05-03 VITALS — BP 95/61 | HR 64 | Ht 69.0 in | Wt 137.8 lb

## 2024-05-03 DIAGNOSIS — G40009 Localization-related (focal) (partial) idiopathic epilepsy and epileptic syndromes with seizures of localized onset, not intractable, without status epilepticus: Secondary | ICD-10-CM

## 2024-05-03 MED ORDER — VALTOCO 15 MG DOSE 2 X 7.5 MG/0.1ML NA LQPK: NASAL | 5 refills | Status: AC

## 2024-05-03 MED ORDER — TOPIRAMATE 200 MG PO TABS: 200.0000 mg | ORAL_TABLET | Freq: Two times a day (BID) | ORAL | 3 refills | Status: AC

## 2024-05-03 MED ORDER — CENOBAMATE 100 MG PO TABS: 100.0000 mg | ORAL_TABLET | Freq: Every day | ORAL | 3 refills | Status: AC

## 2024-05-03 NOTE — Patient Instructions (Signed)
 Good to see you doing well. Continue Xcopri  100mg  every night and Topiramate  200mg  twice a day. Follow-up in 3 months, call for any changes.    Seizure precautions: 1. If medication has been prescribed for you to prevent seizures, take it exactly as directed.  Do not stop taking the medicine without talking to your doctor first, even if you have not had a seizure in a long time.   2. Avoid activities in which a seizure would cause danger to yourself or to others.  Don't operate dangerous machinery, swim alone, or climb in high or dangerous places, such as on ladders, roofs, or girders.  Do not drive unless your doctor says you may.  3. If you have any warning that you may have a seizure, lay down in a safe place where you can't hurt yourself.    4.  No driving for 6 months from last seizure, as per [place]  state law.   Please refer to the following link on the [employer]ile[employer] of [employer]'s website for more information: http://www.epilepsyfoundation.org/answerplace/Social/driving/drivingu.cfm   5.  Maintain good sleep hygiene. Avoid alcohol.  6.  Contact your doctor if you have any problems that may be related to the medicine you are taking.  7.  Call 911 and bring the patient back to the ED if:        A.  The seizure lasts longer than 5 minutes.       B.  The patient doesn't awaken shortly after the seizure  C.  The patient has new problems such as difficulty seeing, speaking or moving  D.  The patient was injured during the seizure  E.  The patient has a temperature over 102 F (39C)  F.  The patient vomited and now is having trouble breathing

## 2024-05-03 NOTE — Progress Notes (Signed)
 "  NEUROLOGY FOLLOW UP OFFICE NOTE  ANH MANGANO [id number] 1995/05/29  Discussed the use of AI scribe software for clinical note transcription with the patient, who gave verbal consent to proceed.  History of Present Illness I had the pleasure of seeing Janard Culp in follow-up in the neurology clinic on 05/03/2024.  The patient was last seen 3 months ago for recurrent seizures.  He is alone in the office today.  Records and images were personally reviewed where available. Since his last visit, he was admitted for characterization at [facility] EMU from Jan 12-15, 2026, baseline EEG showed spikes in the right posterior temporal region. There was one electrographic seizure arising from the right posterior temporal region. He denies any seizures since 02/06/2024, he is on Topiramate  200mg  BID and Cenobamate  100mg  daily without side effects.   He denies any staring/unresponsive episodes, gaps in time, olfactory/gustatory hallucinations, focal numbness/tingling/weakness, myoclonic jerks. He has occasional tingling after taking night dose of Topiramate . He has mild morning headaches if medication is taken without food, resolving after eating. No dizziness, vision changes, no falls. Mood is generally stable, with occasional mild moodiness that is improving. 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 He lives with his parents and has abstained from alcohol as instructed.   History on Initial Assessment 03/24/2022: This is a 29 year old right-handed man presenting for evaluation of new onset seizures. His mother is present to provide additional information. He was in his usual state of health until 02/04/22. He recalls sleeping beside his 30 year old, woke up an hour later feeling fine and going back to sleep. He then woke up and saw the girl's mother with blood around the room. The child had called her mother, he apparently got up from bed, went to  the bathroom, then as he came out he fell and had a seizure. He hit the left side of his head and then went back to bed but has no recollection of this. He had bit his tongue, urinary incontinence, and required staples for the head injury. He was brought to the ER where bloodwork showed a WBC of 12, EtOH 10. Head CT no acute changes, venous sinuses were noted to be mildly hyperdense, nonspecific but can be seen in the setting of dehydration. He then had another seizure on 03/15/22. He recalls feeling tired the day prior. He was visiting a friend who told him that something was off when he got there, he went to sleep then woke up to EMS around him. He was brought to [facility], per ER notes, girlfriend woke up to patient having a seizure in bed, he bit his tongue and had nausea/vomiting. Head CT noted a vague small focus of hypoattenuation in the right frontal lobe that is favored to be artifactual. Bloodwork normal. He was discharged home on Levetiracetam  750mg  BID.He reports missing 2 doses the day before Christmas then having another nocturnal seizure on 03/22/22. He again bit his tongue. He has some drowsiness with the Levetiracetam . He has occasional body twitches in his head, neck, and back that he attributes to bulging discs. His legs would just jump uncontrollable sometimes, twitching and trembling. After a long day, his head may twitch uncontrollably. No associated confusion. This started around are [age]. He has occasional episodes of deja vu. No staring/unresponsive episodes, focal numbness/tingling/weakness, olfactory/gustatory hallucinations. He used to have migraines but denies any more headaches. No dizziness,  diplopia, dysarthria/dysphagia, bowel/bladder dysfunction. He usually gets 7-9 hours of sleep. He drinks 2-3 liquor beverages daily. He smokes marijuana. He denies any alcohol prior to the first seizure but recalls having a drink the night before the second and third seizures. He lives with  his mother. Mood is fine.   Epilepsy Risk Factors:  His mother had one seizure and takes Topiramate . HIs father had 2 alcohol-related seizures. He had a normal birth and early development.  There is no history of febrile convulsions, CNS infections such as meningitis/encephalitis, significant traumatic brain injury, neurosurgical procedures, or family history of seizures.  Prior ASMs: Levetiracetam  (mood changes), Zonisamide  (waves of emotions he breaks down and cries everyday)   Diagnostic Data: MRI brain with and without contrast 08/2022 normal, hippocampi symmetric with no abnormal signal or enhancement seen 1-hour EEG in 07/2022 normal  PAST MEDICAL HISTORY: Past Medical History:  Diagnosis Date   Seizures (HCC) 02-05-22    MEDICATIONS: Medications Ordered Prior to Encounter[1]  ALLERGIES: Allergies[2]  FAMILY HISTORY: Family History  Problem Relation Age of Onset   Seizures Mother        one only in HS   Cancer Mother 75       breast   Hypertension Mother    Hypertension Father    Alcohol abuse Father    Seizures Father        etoh withdrawal   CAD Other        50-60's. maternal side    SOCIAL HISTORY: Social History   Socioeconomic History   Marital status: Single    Spouse name: Not on file   Number of children: 0   Years of education: Not on file   Highest education level: Not on file  Occupational History   Not on file  Tobacco Use   Smoking status: Former    Types: Cigars   Smokeless tobacco: Never  Vaping Use   Vaping status: Every Day  Substance and Sexual Activity   Alcohol use: Not Currently    Alcohol/week: 7.0 standard drinks of alcohol    Types: 7 Shots of liquor per week    Comment: socially drink or two on the weekends   Drug use: Yes    Types: Marijuana    Comment: Daily   Sexual activity: Yes    Partners: Female    Birth control/protection: None  Other Topics Concern   Not on file  Social History Narrative   Unem.  1 step dau.    Occ hosts parties on weekends.   Are you right handed or left handed? Right    Are you currently employed ? No    What is your current occupation?   Do you live at home alone? no   Who lives with you? With family    What type of home do you live in: 1 story or 2 story?  15 steps        Social Drivers of Health   Tobacco Use: Medium Risk (05/03/2024)   Patient History    Smoking Tobacco Use: Former    Smokeless Tobacco Use: Never    Passive Exposure: Not on file  Financial Resource Strain: High Risk (06/17/2023)   Overall Financial Resource Strain (CARDIA)    Difficulty of Paying Living Expenses: Hard  Food Insecurity: No Food Insecurity (11/24/2023)   Epic    Worried About Running Out of Food in the Last Year: Never true    Ran Out of Food in the Last Year:  Never true  Transportation Needs: No Transportation Needs (11/24/2023)   Epic    Lack of Transportation (Medical): No    Lack of Transportation (Non-Medical): No  Physical Activity: Inactive (06/17/2023)   Exercise Vital Sign    Days of Exercise per Week: 0 days    Minutes of Exercise per Session: 0 min  Stress: Stress Concern Present (06/17/2023)   [employer] of Occupational Health - Occupational Stress Questionnaire    Feeling of Stress : Rather much  Social Connections: Socially Isolated (06/17/2023)   Social Connection and Isolation Panel    Frequency of Communication with Friends and Family: Never    Frequency of Social Gatherings with Friends and Family: Never    Attends Religious Services: 1 to 4 times per year    Active Member of Clubs or Organizations: No    Attends Banker Meetings: Never    Marital Status: Never married  Intimate Partner Violence: Not At Risk (11/24/2023)   Epic    Fear of Current or Ex-Partner: No    Emotionally Abused: No    Physically Abused: No    Sexually Abused: No  Depression (PHQ2-9): Low Risk (08/29/2023)   Depression (PHQ2-9)    PHQ-2 Score: 0  Recent Concern:  Depression (PHQ2-9) - Medium Risk (07/20/2023)   Depression (PHQ2-9)    PHQ-2 Score: 5  Alcohol Screen: Low Risk (06/17/2023)   Alcohol Screen    Last Alcohol Screening Score (AUDIT): 6  Housing: Low Risk (11/24/2023)   Epic    Unable to Pay for Housing in the Last Year: No    Number of Times Moved in the Last Year: 0    Homeless in the Last Year: No  Utilities: Not At Risk (11/24/2023)   Epic    Threatened with loss of utilities: No  Health Literacy: Adequate Health Literacy (06/17/2023)   B1300 Health Literacy    Frequency of need for help with medical instructions: Never     PHYSICAL EXAM: Vitals:   05/03/24 1005  BP: 95/61  Pulse: 64  SpO2: 98%   General: No acute distress Head:  Normocephalic/atraumatic Skin/Extremities: No rash, no edema Neurological Exam: alert and awake, and time. No aphasia or dysarthria. Fund of knowledge is appropriate.  Attention and concentration are normal.   Cranial nerves: Pupils equal, round. Extraocular movements intact with no nystagmus. Visual fields full.  No facial asymmetry.  Motor: Bulk and tone normal, muscle strength 5/5 throughout with no pronator drift.   Finger to nose testing intact.  Gait narrow-based and steady, able to tandem walk adequately.  Romberg negative.   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 MRI brain normal. He has been seizure-free since 02/06/2024 on Cenobamate  100mg  at bedtime and Topiramate  200mg  BID, refills sent. He has prn [facility]  for seizure rescue. He is aware of [place] driving laws to stop driving after a seizure until 6 months seizure-free. Follow-up in 3 months, call for any changes.   Thank you for allowing me to participate in his care.  Please do not hesitate to call for any questions or concerns.   Darice Shivers, M.D.   CC: Dr. Wendolyn     [1]  Current Outpatient Medications on File  Prior to Visit  Medication Sig Dispense Refill   Cenobamate  100 MG TABS Take 1 tablet (100 mg total) by mouth at bedtime. 90 tablet 2  diazePAM , 15 MG Dose, (VALTOCO  15 MG DOSE) 2 x 7.5 MG/0.1ML LQPK Administer one spray in one nostril, other spray in other nostril (one dose) as needed for seizure. May use another dose 4 hours after if needed. 10 each 5   naproxen  (NAPROSYN ) 500 MG tablet Take 1 tablet (500 mg total) by mouth 2 (two) times daily. (Patient taking differently: Take 500 mg by mouth daily as needed for mild pain (pain score 1-3) or moderate pain (pain score 4-6).) 30 tablet 0   ondansetron  (ZOFRAN -ODT) 4 MG disintegrating tablet Take 1 tablet (4 mg total) by mouth every 8 (eight) hours as needed. 20 tablet 6   topiramate  (TOPAMAX ) 200 MG tablet Take 1 tablet (200 mg total) by mouth 2 (two) times daily. 180 tablet 3   No current facility-administered medications on file prior to visit.  [2] No Known Allergies  "

## 2024-08-10 ENCOUNTER — Ambulatory Visit: Payer: Self-pay | Admitting: Neurology
